# Patient Record
Sex: Male | Born: 1940 | Race: White | Hispanic: No | Smoking: Never smoker
Health system: Southern US, Community
[De-identification: ages and names within clinical notes are randomized; demographics above are authoritative.]

## PROBLEM LIST (undated history)

## (undated) DIAGNOSIS — F329 Major depressive disorder, single episode, unspecified: Secondary | ICD-10-CM

## (undated) DIAGNOSIS — E785 Hyperlipidemia, unspecified: Secondary | ICD-10-CM

## (undated) DIAGNOSIS — R0600 Dyspnea, unspecified: Secondary | ICD-10-CM

## (undated) DIAGNOSIS — J60 Coalworker's pneumoconiosis: Secondary | ICD-10-CM

## (undated) DIAGNOSIS — F32A Depression, unspecified: Secondary | ICD-10-CM

## (undated) DIAGNOSIS — F0390 Unspecified dementia without behavioral disturbance: Secondary | ICD-10-CM

## (undated) DIAGNOSIS — R011 Cardiac murmur, unspecified: Secondary | ICD-10-CM

## (undated) DIAGNOSIS — M1990 Unspecified osteoarthritis, unspecified site: Secondary | ICD-10-CM

## (undated) DIAGNOSIS — I200 Unstable angina: Secondary | ICD-10-CM

## (undated) DIAGNOSIS — Z87442 Personal history of urinary calculi: Secondary | ICD-10-CM

## (undated) HISTORY — DX: Unstable angina: I20.0

## (undated) HISTORY — PX: NO PAST SURGERIES: SHX2092

## (undated) HISTORY — PX: KNEE ARTHROPLASTY: SHX992

---

## 2016-10-20 ENCOUNTER — Encounter (INDEPENDENT_AMBULATORY_CARE_PROVIDER_SITE_OTHER): Payer: PRIVATE HEALTH INSURANCE | Admitting: Ophthalmology

## 2016-10-20 DIAGNOSIS — H2513 Age-related nuclear cataract, bilateral: Secondary | ICD-10-CM | POA: Diagnosis not present

## 2016-10-20 DIAGNOSIS — H338 Other retinal detachments: Secondary | ICD-10-CM

## 2016-10-20 DIAGNOSIS — H43813 Vitreous degeneration, bilateral: Secondary | ICD-10-CM

## 2016-10-21 ENCOUNTER — Encounter (HOSPITAL_COMMUNITY): Payer: Self-pay

## 2016-10-21 ENCOUNTER — Encounter (HOSPITAL_COMMUNITY)
Admission: RE | Admit: 2016-10-21 | Discharge: 2016-10-21 | Disposition: A | Discharge status: Home / Self Care | Payer: Medicare Other | Source: Ambulatory Visit | Attending: Ophthalmology | Admitting: Ophthalmology

## 2016-10-21 DIAGNOSIS — Z01810 Encounter for preprocedural cardiovascular examination: Secondary | ICD-10-CM | POA: Diagnosis not present

## 2016-10-21 DIAGNOSIS — H33001 Unspecified retinal detachment with retinal break, right eye: Secondary | ICD-10-CM | POA: Diagnosis present

## 2016-10-21 DIAGNOSIS — Z01812 Encounter for preprocedural laboratory examination: Secondary | ICD-10-CM | POA: Diagnosis present

## 2016-10-21 HISTORY — DX: Depression, unspecified: F32.A

## 2016-10-21 HISTORY — DX: Personal history of urinary calculi: Z87.442

## 2016-10-21 HISTORY — DX: Dyspnea, unspecified: R06.00

## 2016-10-21 HISTORY — DX: Cardiac murmur, unspecified: R01.1

## 2016-10-21 HISTORY — DX: Unspecified dementia, unspecified severity, without behavioral disturbance, psychotic disturbance, mood disturbance, and anxiety: F03.90

## 2016-10-21 HISTORY — DX: Unspecified osteoarthritis, unspecified site: M19.90

## 2016-10-21 HISTORY — DX: Hyperlipidemia, unspecified: E78.5

## 2016-10-21 HISTORY — DX: Major depressive disorder, single episode, unspecified: F32.9

## 2016-10-21 HISTORY — DX: Coalworker's pneumoconiosis: J60

## 2016-10-21 LAB — CBC
HEMATOCRIT: 39.5 % (ref 39.0–52.0)
HEMOGLOBIN: 13.6 g/dL (ref 13.0–17.0)
MCH: 32 pg (ref 26.0–34.0)
MCHC: 34.4 g/dL (ref 30.0–36.0)
MCV: 92.9 fL (ref 78.0–100.0)
Platelets: 154 10*3/uL (ref 150–400)
RBC: 4.25 MIL/uL (ref 4.22–5.81)
RDW: 12.8 % (ref 11.5–15.5)
WBC: 6.2 10*3/uL (ref 4.0–10.5)

## 2016-10-21 LAB — BASIC METABOLIC PANEL
ANION GAP: 8 (ref 5–15)
BUN: 11 mg/dL (ref 6–20)
CALCIUM: 9.1 mg/dL (ref 8.9–10.3)
CHLORIDE: 109 mmol/L (ref 101–111)
CO2: 23 mmol/L (ref 22–32)
Creatinine, Ser: 1.23 mg/dL (ref 0.61–1.24)
GFR calc non Af Amer: 56 mL/min — ABNORMAL LOW (ref >60)
Glucose, Bld: 83 mg/dL (ref 65–99)
Potassium: 3.8 mmol/L (ref 3.5–5.1)
Sodium: 140 mmol/L (ref 135–145)

## 2016-10-21 NOTE — H&P (Signed)
Brett Mitchell is an 76 y.o. male.   Chief Complaint:  Rapid loss of vision right eye over three days 3 HPI:  Rapid and severe vision loss right eye  Past Medical History:  Diagnosis Date  . Arthritis   . Black lung disease (HCC)   . Dementia   . Depression    situational  . Dyspnea    with exertion  . Heart murmur    as a child  . History of kidney stones    passed some- "still has some"  . Hyperlipemia     Past Surgical History:  Procedure Laterality Date  . NO PAST SURGERIES      No family history on file. Social History:  reports that he has never smoked. He has quit using smokeless tobacco. He reports that he does not drink alcohol or use drugs.  Allergies: No Known Allergies  No prescriptions prior to admission.    Review of systems otherwise negative  There were no vitals taken for this visit.  Physical exam: Mental status: oriented x3. Eyes: See eye exam associated with this date of surgery in media tab.  Scanned in by scanning center Ears, Nose, Throat: within normal limits Neck: Within Normal limits General: within normal limits Chest: Within normal limits Breast: deferred Heart: Within normal limits Abdomen: Within normal limits GU: deferred Extremities: within normal limits Skin: within normal limits  Assessment/Plan Rhegmatogenous retinal detachment right eye Plan: To [facility] for Scleral buckle, laser, gas injection on right eye.  Possible pars plana vitrectomy Right eye  Sherrie George 10/21/2016, 4:46 PM

## 2016-10-21 NOTE — Progress Notes (Signed)
Brett Mitchell is a resident of [facility].  Patient has lost the sight in his right eye.  Brett Mitchell lives alone some , he is staying with his daughter in [place] at times and  One of his daughters stays with him in his home. Patient denies any heart disease or HTN, patient did not know that Maxide is a HTN medication.  Patient has a history of Black Lung and sleep apnea.  Patient does not have a CPAP, he did , but he did not use it so he returned it.  Patient reports that he does not see a pulmonologist. I requested reports for PCP 's office, and from [facility] where he was tested and confirmed for [facility].

## 2016-10-21 NOTE — Pre-Procedure Instructions (Addendum)
    Brett Mitchell  10/21/2016      Your procedure is scheduled on Tuesday, Januray 16.  Report to [facility] Admitting at 9:00AM  - as instructed by Dr Ashley Royalty.   Call this number if you have problems the morning of surgery: [phone]    Remember:  Do not eat food or drink liquids after midnight Monday, January 15.  Take these medicines the morning of surgery with A SIP OF WATER: As instructed by Dr. Ashley Royalty.   Do not wear jewelry, make-up or nail polish.  Do not wear lotions, powders, or perfumes, or deodorant.              Men may shave face and neck.  Do not bring valuables to the hospital.  [facility] is not responsible for any belongings or valuables.  Contacts, dentures or bridgework may not be worn into surgery.  Leave your suitcase in the car.  After surgery it may be brought to your room.  For patients admitted to the hospital, discharge time will be determined by your treatment team.  Special instructions:  Review  [place] - Preparing For Surgery.  Please read over the following fact sheets that you were given. Review  [place] - Preparing For Surgery.

## 2016-10-26 ENCOUNTER — Encounter (HOSPITAL_COMMUNITY)
Admission: RE | Disposition: A | Discharge status: Home / Self Care | Payer: Self-pay | Source: Ambulatory Visit | Attending: Ophthalmology

## 2016-10-26 ENCOUNTER — Ambulatory Visit (HOSPITAL_COMMUNITY): Payer: Medicare Other | Admitting: Anesthesiology

## 2016-10-26 ENCOUNTER — Ambulatory Visit (HOSPITAL_COMMUNITY)
Admission: RE | Admit: 2016-10-26 | Discharge: 2016-10-27 | Disposition: A | Discharge status: Home / Self Care | Payer: Medicare Other | Source: Ambulatory Visit | Attending: Ophthalmology | Admitting: Ophthalmology

## 2016-10-26 ENCOUNTER — Ambulatory Visit (HOSPITAL_COMMUNITY): Payer: Medicare Other | Admitting: Emergency Medicine

## 2016-10-26 ENCOUNTER — Encounter (HOSPITAL_COMMUNITY): Payer: Self-pay | Admitting: *Deleted

## 2016-10-26 DIAGNOSIS — J60 Coalworker's pneumoconiosis: Secondary | ICD-10-CM | POA: Insufficient documentation

## 2016-10-26 DIAGNOSIS — Z87891 Personal history of nicotine dependence: Secondary | ICD-10-CM | POA: Insufficient documentation

## 2016-10-26 DIAGNOSIS — H33001 Unspecified retinal detachment with retinal break, right eye: Secondary | ICD-10-CM | POA: Insufficient documentation

## 2016-10-26 DIAGNOSIS — Z87442 Personal history of urinary calculi: Secondary | ICD-10-CM | POA: Diagnosis not present

## 2016-10-26 DIAGNOSIS — Z79899 Other long term (current) drug therapy: Secondary | ICD-10-CM | POA: Insufficient documentation

## 2016-10-26 DIAGNOSIS — F0390 Unspecified dementia without behavioral disturbance: Secondary | ICD-10-CM | POA: Diagnosis not present

## 2016-10-26 DIAGNOSIS — F329 Major depressive disorder, single episode, unspecified: Secondary | ICD-10-CM | POA: Diagnosis not present

## 2016-10-26 DIAGNOSIS — Z833 Family history of diabetes mellitus: Secondary | ICD-10-CM | POA: Diagnosis not present

## 2016-10-26 DIAGNOSIS — E785 Hyperlipidemia, unspecified: Secondary | ICD-10-CM | POA: Insufficient documentation

## 2016-10-26 DIAGNOSIS — H5461 Unqualified visual loss, right eye, normal vision left eye: Secondary | ICD-10-CM | POA: Diagnosis present

## 2016-10-26 DIAGNOSIS — H338 Other retinal detachments: Secondary | ICD-10-CM | POA: Diagnosis not present

## 2016-10-26 HISTORY — PX: SCLERAL BUCKLE: SHX5340

## 2016-10-26 SURGERY — SCLERAL BUCKLE
Anesthesia: General | Site: Eye | Laterality: Right

## 2016-10-26 MED ORDER — SERTRALINE HCL 100 MG PO TABS: 150.0000 mg | ORAL_TABLET | Freq: Every day | ORAL | Status: DC

## 2016-10-26 MED ORDER — EPHEDRINE SULFATE 50 MG/ML IJ SOLN
INTRAMUSCULAR | Status: DC | PRN
  Administered 2016-10-26: 5 mg via INTRAVENOUS

## 2016-10-26 MED ORDER — TIMOLOL MALEATE 0.5 % OP SOLN
1.0000 [drp] | Freq: Two times a day (BID) | OPHTHALMIC | Status: DC
  Administered 2016-10-26: 1 [drp] via OPHTHALMIC
  Filled 2016-10-26: qty 5

## 2016-10-26 MED ORDER — BUPIVACAINE HCL (PF) 0.75 % IJ SOLN
INTRAMUSCULAR | Status: DC | PRN
  Administered 2016-10-26: 10 mL

## 2016-10-26 MED ORDER — STERILE WATER FOR INJECTION IJ SOLN
INTRAMUSCULAR | Status: AC
  Filled 2016-10-26: qty 20

## 2016-10-26 MED ORDER — HYDROMORPHONE HCL 1 MG/ML IJ SOLN
0.2500 mg | INTRAMUSCULAR | Status: DC | PRN
  Administered 2016-10-26: 0.5 mg via INTRAVENOUS

## 2016-10-26 MED ORDER — GATIFLOXACIN 0.5 % OP SOLN: 1.0000 [drp] | OPHTHALMIC | Status: DC | PRN

## 2016-10-26 MED ORDER — DORZOLAMIDE HCL-TIMOLOL MAL 2-0.5 % OP SOLN: 1.0000 [drp] | Freq: Two times a day (BID) | OPHTHALMIC | Status: DC

## 2016-10-26 MED ORDER — ROCURONIUM BROMIDE 100 MG/10ML IV SOLN
INTRAVENOUS | Status: DC | PRN
  Administered 2016-10-26: 50 mg via INTRAVENOUS
  Administered 2016-10-26: 20 mg via INTRAVENOUS

## 2016-10-26 MED ORDER — PHENYLEPHRINE 40 MCG/ML (10ML) SYRINGE FOR IV PUSH (FOR BLOOD PRESSURE SUPPORT)
PREFILLED_SYRINGE | INTRAVENOUS | Status: AC
  Filled 2016-10-26: qty 10

## 2016-10-26 MED ORDER — BACITRACIN-POLYMYXIN B 500-10000 UNIT/GM OP OINT
TOPICAL_OINTMENT | OPHTHALMIC | Status: AC
Start: 2016-10-26 — End: 2016-10-26
  Filled 2016-10-26: qty 3.5

## 2016-10-26 MED ORDER — STERILE WATER FOR INJECTION IJ SOLN
INTRAMUSCULAR | Status: DC | PRN
  Administered 2016-10-26: 20 mL

## 2016-10-26 MED ORDER — ALBUTEROL SULFATE (2.5 MG/3ML) 0.083% IN NEBU
3.0000 mL | INHALATION_SOLUTION | RESPIRATORY_TRACT | Status: DC | PRN
Start: 2016-10-26 — End: 2016-10-27

## 2016-10-26 MED ORDER — BACITRACIN-POLYMYXIN B 500-10000 UNIT/GM OP OINT
1.0000 "application " | TOPICAL_OINTMENT | Freq: Three times a day (TID) | OPHTHALMIC | Status: DC
  Filled 2016-10-26: qty 3.5

## 2016-10-26 MED ORDER — SUGAMMADEX SODIUM 200 MG/2ML IV SOLN
INTRAVENOUS | Status: AC
  Filled 2016-10-26: qty 2

## 2016-10-26 MED ORDER — HYDROCODONE-ACETAMINOPHEN 5-325 MG PO TABS
ORAL_TABLET | ORAL | Status: AC
  Administered 2016-10-26: 2 via ORAL
  Filled 2016-10-26: qty 2

## 2016-10-26 MED ORDER — ONDANSETRON HCL 4 MG/2ML IJ SOLN
INTRAMUSCULAR | Status: AC
  Filled 2016-10-26: qty 4

## 2016-10-26 MED ORDER — DONEPEZIL HCL 10 MG PO TABS: 10.0000 mg | ORAL_TABLET | Freq: Every evening | ORAL | Status: DC

## 2016-10-26 MED ORDER — STERILE WATER FOR IRRIGATION IR SOLN
Status: DC | PRN
  Administered 2016-10-26: 200 mL

## 2016-10-26 MED ORDER — PREDNISOLONE ACETATE 1 % OP SUSP
1.0000 [drp] | Freq: Four times a day (QID) | OPHTHALMIC | Status: DC
  Filled 2016-10-26: qty 5

## 2016-10-26 MED ORDER — BRIMONIDINE TARTRATE 0.2 % OP SOLN
1.0000 [drp] | Freq: Two times a day (BID) | OPHTHALMIC | Status: DC
  Filled 2016-10-26: qty 5

## 2016-10-26 MED ORDER — SUGAMMADEX SODIUM 200 MG/2ML IV SOLN
INTRAVENOUS | Status: AC
  Filled 2016-10-26: qty 4

## 2016-10-26 MED ORDER — ONDANSETRON HCL 4 MG/2ML IJ SOLN: 4.0000 mg | Freq: Four times a day (QID) | INTRAMUSCULAR | Status: DC | PRN

## 2016-10-26 MED ORDER — PROMETHAZINE HCL 25 MG/ML IJ SOLN: 6.2500 mg | INTRAMUSCULAR | Status: DC | PRN

## 2016-10-26 MED ORDER — SODIUM CHLORIDE 0.9 % IV SOLN
INTRAVENOUS | Status: DC
  Administered 2016-10-26 (×3): via INTRAVENOUS

## 2016-10-26 MED ORDER — FAMOTIDINE 10 MG PO TABS
10.0000 mg | ORAL_TABLET | Freq: Two times a day (BID) | ORAL | Status: DC
  Administered 2016-10-26: 10 mg via ORAL
  Filled 2016-10-26: qty 1

## 2016-10-26 MED ORDER — CLONAZEPAM 1 MG PO TABS
1.0000 mg | ORAL_TABLET | Freq: Every day | ORAL | Status: DC
  Administered 2016-10-26: 1 mg via ORAL
  Filled 2016-10-26: qty 1

## 2016-10-26 MED ORDER — TEMAZEPAM 15 MG PO CAPS: 15.0000 mg | ORAL_CAPSULE | Freq: Every evening | ORAL | Status: DC | PRN

## 2016-10-26 MED ORDER — BUPIVACAINE HCL (PF) 0.75 % IJ SOLN
INTRAMUSCULAR | Status: AC
  Filled 2016-10-26: qty 10

## 2016-10-26 MED ORDER — LABETALOL HCL 5 MG/ML IV SOLN
INTRAVENOUS | Status: AC
  Filled 2016-10-26: qty 4

## 2016-10-26 MED ORDER — DORZOLAMIDE HCL 2 % OP SOLN
1.0000 [drp] | Freq: Three times a day (TID) | OPHTHALMIC | Status: DC
  Filled 2016-10-26: qty 10

## 2016-10-26 MED ORDER — LIDOCAINE 2% (20 MG/ML) 5 ML SYRINGE
INTRAMUSCULAR | Status: AC
  Filled 2016-10-26: qty 5

## 2016-10-26 MED ORDER — PROPOFOL 10 MG/ML IV BOLUS
INTRAVENOUS | Status: DC | PRN
  Administered 2016-10-26: 150 mg via INTRAVENOUS

## 2016-10-26 MED ORDER — IBUPROFEN 800 MG PO TABS
800.0000 mg | ORAL_TABLET | Freq: Three times a day (TID) | ORAL | Status: DC | PRN
  Administered 2016-10-26: 800 mg via ORAL
  Filled 2016-10-26: qty 1

## 2016-10-26 MED ORDER — VALACYCLOVIR HCL 500 MG PO TABS
500.0000 mg | ORAL_TABLET | Freq: Every day | ORAL | Status: DC
  Filled 2016-10-26 (×2): qty 1

## 2016-10-26 MED ORDER — TROPICAMIDE 1 % OP SOLN: 1.0000 [drp] | OPHTHALMIC | Status: DC | PRN

## 2016-10-26 MED ORDER — CEFTAZIDIME 1 G IJ SOLR
INTRAMUSCULAR | Status: AC
  Filled 2016-10-26: qty 1

## 2016-10-26 MED ORDER — GATIFLOXACIN 0.5 % OP SOLN
OPHTHALMIC | Status: AC
  Filled 2016-10-26: qty 2.5

## 2016-10-26 MED ORDER — ONDANSETRON HCL 4 MG/2ML IJ SOLN
INTRAMUSCULAR | Status: DC | PRN
  Administered 2016-10-26: 4 mg via INTRAVENOUS

## 2016-10-26 MED ORDER — DORZOLAMIDE HCL 2 % OP SOLN
1.0000 [drp] | Freq: Two times a day (BID) | OPHTHALMIC | Status: DC
  Administered 2016-10-26: 1 [drp] via OPHTHALMIC
  Filled 2016-10-26: qty 10

## 2016-10-26 MED ORDER — DEXAMETHASONE SODIUM PHOSPHATE 10 MG/ML IJ SOLN
INTRAMUSCULAR | Status: DC | PRN
  Administered 2016-10-26: 10 mg

## 2016-10-26 MED ORDER — POLYMYXIN B SULFATE 500000 UNITS IJ SOLR
INTRAMUSCULAR | Status: AC
Start: 2016-10-26 — End: 2016-10-26
  Filled 2016-10-26: qty 500000

## 2016-10-26 MED ORDER — FENTANYL CITRATE (PF) 100 MCG/2ML IJ SOLN
INTRAMUSCULAR | Status: AC
  Filled 2016-10-26: qty 2

## 2016-10-26 MED ORDER — CYPROHEPTADINE HCL 4 MG PO TABS: 4.0000 mg | ORAL_TABLET | Freq: Every day | ORAL | Status: DC

## 2016-10-26 MED ORDER — DEXAMETHASONE SODIUM PHOSPHATE 10 MG/ML IJ SOLN
INTRAMUSCULAR | Status: AC
  Filled 2016-10-26: qty 1

## 2016-10-26 MED ORDER — TRAZODONE HCL 50 MG PO TABS
50.0000 mg | ORAL_TABLET | Freq: Every day | ORAL | Status: DC
  Filled 2016-10-26: qty 1

## 2016-10-26 MED ORDER — BSS IO SOLN
INTRAOCULAR | Status: DC | PRN
  Administered 2016-10-26: 15 mL via INTRAOCULAR

## 2016-10-26 MED ORDER — FENTANYL CITRATE (PF) 100 MCG/2ML IJ SOLN
INTRAMUSCULAR | Status: DC | PRN
  Administered 2016-10-26 (×2): 25 ug via INTRAVENOUS
  Administered 2016-10-26: 50 ug via INTRAVENOUS

## 2016-10-26 MED ORDER — HYDROMORPHONE HCL 1 MG/ML IJ SOLN
INTRAMUSCULAR | Status: AC
Start: 2016-10-26 — End: 2016-10-27
  Filled 2016-10-26: qty 0.5

## 2016-10-26 MED ORDER — EPINEPHRINE PF 1 MG/ML IJ SOLN
INTRAMUSCULAR | Status: AC
  Filled 2016-10-26: qty 1

## 2016-10-26 MED ORDER — CYCLOPENTOLATE HCL 1 % OP SOLN
OPHTHALMIC | Status: AC
  Administered 2016-10-26: 12:00:00
  Filled 2016-10-26: qty 2

## 2016-10-26 MED ORDER — ONDANSETRON HCL 4 MG/2ML IJ SOLN
INTRAMUSCULAR | Status: AC
  Filled 2016-10-26: qty 2

## 2016-10-26 MED ORDER — TRIAMTERENE-HCTZ 37.5-25 MG PO TABS
1.0000 | ORAL_TABLET | Freq: Every day | ORAL | Status: DC
  Filled 2016-10-26 (×2): qty 1

## 2016-10-26 MED ORDER — CEFAZOLIN SODIUM-DEXTROSE 2-4 GM/100ML-% IV SOLN
2.0000 g | INTRAVENOUS | Status: AC
  Administered 2016-10-26: 2 g via INTRAVENOUS
  Filled 2016-10-26: qty 100

## 2016-10-26 MED ORDER — GLYCOPYRROLATE 0.2 MG/ML IJ SOLN
INTRAMUSCULAR | Status: DC | PRN
  Administered 2016-10-26: 0.2 mg via INTRAVENOUS
  Administered 2016-10-26: 0.1 mg via INTRAVENOUS

## 2016-10-26 MED ORDER — PHENYLEPHRINE HCL 2.5 % OP SOLN
1.0000 [drp] | OPHTHALMIC | Status: AC | PRN
  Administered 2016-10-26 (×3): 1 [drp] via OPHTHALMIC

## 2016-10-26 MED ORDER — ACETAMINOPHEN 325 MG PO TABS: 325.0000 mg | ORAL_TABLET | ORAL | Status: DC | PRN

## 2016-10-26 MED ORDER — BACITRACIN-POLYMYXIN B 500-10000 UNIT/GM OP OINT
TOPICAL_OINTMENT | OPHTHALMIC | Status: DC | PRN
  Administered 2016-10-26: 1 via OPHTHALMIC

## 2016-10-26 MED ORDER — BSS IO SOLN
INTRAOCULAR | Status: AC
  Filled 2016-10-26: qty 15

## 2016-10-26 MED ORDER — ROCURONIUM BROMIDE 50 MG/5ML IV SOSY
PREFILLED_SYRINGE | INTRAVENOUS | Status: AC
  Filled 2016-10-26: qty 10

## 2016-10-26 MED ORDER — GATIFLOXACIN 0.5 % OP SOLN
1.0000 [drp] | Freq: Four times a day (QID) | OPHTHALMIC | Status: DC
  Filled 2016-10-26: qty 2.5

## 2016-10-26 MED ORDER — TROPICAMIDE 1 % OP SOLN
OPHTHALMIC | Status: AC
  Filled 2016-10-26: qty 15

## 2016-10-26 MED ORDER — HYDROCODONE-ACETAMINOPHEN 5-325 MG PO TABS
1.0000 | ORAL_TABLET | ORAL | Status: DC | PRN
  Administered 2016-10-26: 2 via ORAL
  Filled 2016-10-26: qty 2

## 2016-10-26 MED ORDER — BSS PLUS IO SOLN
INTRAOCULAR | Status: AC
  Filled 2016-10-26: qty 500

## 2016-10-26 MED ORDER — LIDOCAINE HCL 2 % IJ SOLN
INTRAMUSCULAR | Status: AC
  Filled 2016-10-26: qty 20

## 2016-10-26 MED ORDER — CYCLOPENTOLATE HCL 1 % OP SOLN: 1.0000 [drp] | OPHTHALMIC | Status: DC | PRN

## 2016-10-26 MED ORDER — TROPICAMIDE 1 % OP SOLN
1.0000 [drp] | OPHTHALMIC | Status: AC | PRN
  Administered 2016-10-26 (×3): 1 [drp] via OPHTHALMIC

## 2016-10-26 MED ORDER — SUCCINYLCHOLINE CHLORIDE 200 MG/10ML IV SOSY
PREFILLED_SYRINGE | INTRAVENOUS | Status: AC
Start: 2016-10-26 — End: 2016-10-26
  Filled 2016-10-26: qty 10

## 2016-10-26 MED ORDER — LIDOCAINE HCL (CARDIAC) 20 MG/ML IV SOLN
INTRAVENOUS | Status: DC | PRN
  Administered 2016-10-26: 60 mg via INTRATRACHEAL
  Administered 2016-10-26: 100 mg via INTRAVENOUS

## 2016-10-26 MED ORDER — SODIUM CHLORIDE 0.45 % IV SOLN: INTRAVENOUS | Status: DC

## 2016-10-26 MED ORDER — 0.9 % SODIUM CHLORIDE (POUR BTL) OPTIME
TOPICAL | Status: DC | PRN
  Administered 2016-10-26: 200 mL

## 2016-10-26 MED ORDER — PHENYLEPHRINE HCL 2.5 % OP SOLN: 1.0000 [drp] | OPHTHALMIC | Status: DC | PRN

## 2016-10-26 MED ORDER — SUGAMMADEX SODIUM 500 MG/5ML IV SOLN
INTRAVENOUS | Status: AC
  Filled 2016-10-26: qty 5

## 2016-10-26 MED ORDER — ACETAZOLAMIDE SODIUM 500 MG IJ SOLR
500.0000 mg | Freq: Once | INTRAMUSCULAR | Status: AC
  Administered 2016-10-27: 500 mg via INTRAVENOUS

## 2016-10-26 MED ORDER — LIDOCAINE 2% (20 MG/ML) 5 ML SYRINGE
INTRAMUSCULAR | Status: AC
  Filled 2016-10-26: qty 20

## 2016-10-26 MED ORDER — FENOFIBRATE 54 MG PO TABS: 54.0000 mg | ORAL_TABLET | Freq: Every day | ORAL | Status: DC

## 2016-10-26 MED ORDER — PHENYLEPHRINE HCL 2.5 % OP SOLN
OPHTHALMIC | Status: AC
  Filled 2016-10-26: qty 2

## 2016-10-26 MED ORDER — MORPHINE SULFATE (PF) 2 MG/ML IV SOLN
1.0000 mg | INTRAVENOUS | Status: DC | PRN
  Administered 2016-10-26: 2 mg via INTRAVENOUS
  Filled 2016-10-26: qty 1

## 2016-10-26 MED ORDER — OXYCODONE HCL ER 10 MG PO T12A
10.0000 mg | EXTENDED_RELEASE_TABLET | Freq: Two times a day (BID) | ORAL | Status: DC
  Administered 2016-10-26: 10 mg via ORAL
  Filled 2016-10-26: qty 1

## 2016-10-26 MED ORDER — LATANOPROST 0.005 % OP SOLN
1.0000 [drp] | Freq: Every day | OPHTHALMIC | Status: DC
  Filled 2016-10-26: qty 2.5

## 2016-10-26 MED ORDER — EPHEDRINE 5 MG/ML INJ
INTRAVENOUS | Status: AC
  Filled 2016-10-26: qty 10

## 2016-10-26 MED ORDER — SUGAMMADEX SODIUM 200 MG/2ML IV SOLN
INTRAVENOUS | Status: DC | PRN
  Administered 2016-10-26: 200 mg via INTRAVENOUS

## 2016-10-26 MED ORDER — MAGNESIUM HYDROXIDE 400 MG/5ML PO SUSP: 15.0000 mL | Freq: Four times a day (QID) | ORAL | Status: DC | PRN

## 2016-10-26 MED ORDER — OXYCODONE HCL 5 MG PO TABS: 10.0000 mg | ORAL_TABLET | Freq: Four times a day (QID) | ORAL | Status: DC | PRN

## 2016-10-26 MED ORDER — CYCLOPENTOLATE HCL 1 % OP SOLN
1.0000 [drp] | OPHTHALMIC | Status: DC | PRN
  Administered 2016-10-26 (×2): 1 [drp] via OPHTHALMIC

## 2016-10-26 MED ORDER — HEMOSTATIC AGENTS (NO CHARGE) OPTIME
TOPICAL | Status: DC | PRN
  Administered 2016-10-26: 1 via TOPICAL

## 2016-10-26 MED ORDER — ROCURONIUM BROMIDE 50 MG/5ML IV SOSY
PREFILLED_SYRINGE | INTRAVENOUS | Status: AC
  Filled 2016-10-26: qty 5

## 2016-10-26 MED ORDER — ATROPINE SULFATE 1 % OP SOLN
OPHTHALMIC | Status: AC
  Filled 2016-10-26: qty 5

## 2016-10-26 MED ORDER — SODIUM CHLORIDE 0.9 % IJ SOLN
INTRAMUSCULAR | Status: AC
  Filled 2016-10-26: qty 10

## 2016-10-26 MED ORDER — HYPROMELLOSE (GONIOSCOPIC) 2.5 % OP SOLN
OPHTHALMIC | Status: AC
  Filled 2016-10-26: qty 15

## 2016-10-26 MED ORDER — TRIAMCINOLONE ACETONIDE 40 MG/ML IJ SUSP
INTRAMUSCULAR | Status: AC
  Filled 2016-10-26: qty 5

## 2016-10-26 MED ORDER — GATIFLOXACIN 0.5 % OP SOLN
1.0000 [drp] | OPHTHALMIC | Status: AC | PRN
  Administered 2016-10-26 (×3): 1 [drp] via OPHTHALMIC

## 2016-10-26 MED ORDER — TAMSULOSIN HCL 0.4 MG PO CAPS
0.4000 mg | ORAL_CAPSULE | Freq: Every day | ORAL | Status: DC
  Administered 2016-10-26: 0.4 mg via ORAL
  Filled 2016-10-26: qty 1

## 2016-10-26 MED ORDER — TETRACAINE HCL 0.5 % OP SOLN
2.0000 [drp] | Freq: Once | OPHTHALMIC | Status: DC
  Filled 2016-10-26: qty 2

## 2016-10-26 MED ORDER — ACETAZOLAMIDE SODIUM 500 MG IJ SOLR
INTRAMUSCULAR | Status: AC
  Filled 2016-10-26: qty 500

## 2016-10-26 SURGICAL SUPPLY — 73 items
APPLICATOR DR MATTHEWS STRL (MISCELLANEOUS) ×18 IMPLANT
BANDAGE EYE OVAL (MISCELLANEOUS) ×3 IMPLANT
BLADE EYE CATARACT 19 1.4 BEAV (BLADE) ×3 IMPLANT
BLADE MVR KNIFE 19G (BLADE) IMPLANT
CANNULA ANT CHAM MAIN (OPHTHALMIC RELATED) IMPLANT
CANNULA DUAL BORE 23G (CANNULA) IMPLANT
CORDS BIPOLAR (ELECTRODE) IMPLANT
COTTONBALL LRG STERILE PKG (GAUZE/BANDAGES/DRESSINGS) ×9 IMPLANT
COVER MAYO STAND STRL (DRAPES) IMPLANT
COVER SURGICAL LIGHT HANDLE (MISCELLANEOUS) ×3 IMPLANT
DRAPE INCISE 51X51 W/FILM STRL (DRAPES) ×3 IMPLANT
DRAPE OPHTHALMIC 77X100 STRL (CUSTOM PROCEDURE TRAY) ×3 IMPLANT
ERASER HMR WETFIELD 23G BP (MISCELLANEOUS) IMPLANT
FILTER BLUE MILLIPORE (MISCELLANEOUS) IMPLANT
FILTER STRAW FLUID ASPIR (MISCELLANEOUS) IMPLANT
FORCEPS GRIESHABER ILM 25G A (INSTRUMENTS) IMPLANT
GAS AUTO FILL CONSTEL (OPHTHALMIC)
GAS AUTO FILL CONSTELLATION (OPHTHALMIC) IMPLANT
GLOVE BIOGEL PI IND STRL 7.0 (GLOVE) ×1 IMPLANT
GLOVE BIOGEL PI INDICATOR 7.0 (GLOVE) ×2
GLOVE SS BIOGEL STRL SZ 6.5 (GLOVE) ×2 IMPLANT
GLOVE SS BIOGEL STRL SZ 7 (GLOVE) ×1 IMPLANT
GLOVE SUPERSENSE BIOGEL SZ 6.5 (GLOVE) ×4
GLOVE SUPERSENSE BIOGEL SZ 7 (GLOVE) ×2
GLOVE SURG 8.5 LATEX PF (GLOVE) ×3 IMPLANT
GLOVE SURG SS PI 6.5 STRL IVOR (GLOVE) ×6 IMPLANT
GOWN STRL REUS W/ TWL LRG LVL3 (GOWN DISPOSABLE) ×4 IMPLANT
GOWN STRL REUS W/TWL LRG LVL3 (GOWN DISPOSABLE) ×8
HANDLE PNEUMATIC FOR CONSTEL (OPHTHALMIC) IMPLANT
IMPL SILICONE (Ophthalmic Related) ×1 IMPLANT
IMPLANT SILICONE (Ophthalmic Related) ×5 IMPLANT
KIT BASIN OR (CUSTOM PROCEDURE TRAY) ×3 IMPLANT
KIT PERFLUORON PROCEDURE 5ML (MISCELLANEOUS) IMPLANT
KIT ROOM TURNOVER OR (KITS) ×3 IMPLANT
KNIFE CRESCENT 1.75 EDGEAHEAD (BLADE) IMPLANT
KNIFE GRIESHABER SHARP 2.5MM (MISCELLANEOUS) ×9 IMPLANT
MICROPICK 25G (MISCELLANEOUS)
NEEDLE 18GX1X1/2 (RX/OR ONLY) (NEEDLE) ×3 IMPLANT
NEEDLE 25GX 5/8IN NON SAFETY (NEEDLE) IMPLANT
NEEDLE HYPO 30X.5 LL (NEEDLE) IMPLANT
NEEDLE PRECISIONGLIDE 27X1.5 (NEEDLE) IMPLANT
NS IRRIG 1000ML POUR BTL (IV SOLUTION) ×3 IMPLANT
PACK VITRECTOMY CUSTOM (CUSTOM PROCEDURE TRAY) ×3 IMPLANT
PAD ARMBOARD 7.5X6 YLW CONV (MISCELLANEOUS) ×3 IMPLANT
PAK PIK VITRECTOMY CVS 25GA (OPHTHALMIC) IMPLANT
PIC ILLUMINATED 25G (OPHTHALMIC)
PICK MICROPICK 25G (MISCELLANEOUS) IMPLANT
PIK ILLUMINATED 25G (OPHTHALMIC) IMPLANT
PROBE LASER ILLUM FLEX CVD 25G (OPHTHALMIC) IMPLANT
REPL STRA BRUSH NEEDLE (NEEDLE) IMPLANT
RESERVOIR BACK FLUSH (MISCELLANEOUS) IMPLANT
ROLLS DENTAL (MISCELLANEOUS) ×6 IMPLANT
SLEEVE SCLERAL TYPE 270 (Ophthalmic Related) ×3 IMPLANT
SPEAR EYE SURG WECK-CEL (MISCELLANEOUS) ×12 IMPLANT
SPONGE SURGIFOAM ABS GEL 12-7 (HEMOSTASIS) ×3 IMPLANT
STOPCOCK 4 WAY LG BORE MALE ST (IV SETS) IMPLANT
SUT CHROMIC 7 0 TG140 8 (SUTURE) ×3 IMPLANT
SUT ETHILON 9 0 TG140 8 (SUTURE) IMPLANT
SUT MERSILENE 4 0 RV 2 (SUTURE) ×6 IMPLANT
SUT SILK 2 0 (SUTURE) ×2
SUT SILK 2-0 18XBRD TIE 12 (SUTURE) ×1 IMPLANT
SUT SILK 4 0 RB 1 (SUTURE) ×3 IMPLANT
SYR 20CC LL (SYRINGE) IMPLANT
SYR 50ML LL SCALE MARK (SYRINGE) IMPLANT
SYR 5ML LL (SYRINGE) IMPLANT
SYR BULB 3OZ (MISCELLANEOUS) ×3 IMPLANT
SYR TB 1ML LUER SLIP (SYRINGE) IMPLANT
SYRINGE 10CC LL (SYRINGE) IMPLANT
TIRE RTNL 2.5XGRV CNCV 9X (Ophthalmic Related) ×1 IMPLANT
TOWEL OR 17X24 6PK STRL BLUE (TOWEL DISPOSABLE) ×3 IMPLANT
TUBING HIGH PRESS EXTEN 6IN (TUBING) IMPLANT
WATER STERILE IRR 1000ML POUR (IV SOLUTION) ×3 IMPLANT
WIPE INSTRUMENT VISIWIPE 73X73 (MISCELLANEOUS) IMPLANT

## 2016-10-26 NOTE — Transfer of Care (Signed)
Immediate Anesthesia Transfer of Care Note  Patient: Brett Mitchell  Procedure(s) Performed: Procedure(s): SCLERAL BUCKLE, C3F8 INJECTION, RIGHT EYE (Right)  Patient Location: PACU  Anesthesia Type:General  Level of Consciousness: awake and alert   Airway & Oxygen Therapy: Patient Spontanous Breathing and Patient connected to nasal cannula oxygen  Post-op Assessment: Report given to RN and Post -op Vital signs reviewed and stable  Post vital signs: Reviewed and stable  Last Vitals:  Vitals:   10/26/16 0912 10/26/16 1630  BP: (!) 188/88   Pulse: (!) 58   Resp: 20   Temp: 36.8 C 36.8 C    Last Pain:  Vitals:   10/26/16 1148  TempSrc: Oral      Patients Stated Pain Goal: 2 (10/26/16 1148)  Complications: No apparent anesthesia complications

## 2016-10-26 NOTE — Anesthesia Preprocedure Evaluation (Addendum)
Anesthesia Evaluation  Patient identified by MRN, date of birth, ID band Patient awake    Reviewed: Allergy & Precautions, NPO status , Patient's Chart, lab work & pertinent test results  History of Anesthesia Complications Negative for: history of anesthetic complications  Airway Mallampati: II  TM Distance: >3 FB Neck ROM: Full    Dental  (+) Edentulous Upper   Pulmonary shortness of breath,  Hx of black lung, not on O2   breath sounds clear to auscultation       Cardiovascular  Rhythm:Regular Rate:Normal + Systolic murmurs    Neuro/Psych negative neurological ROS     GI/Hepatic negative GI ROS, Neg liver ROS,   Endo/Other  negative endocrine ROS  Renal/GU negative Renal ROS     Musculoskeletal  (+) Arthritis ,   Abdominal   Peds  Hematology negative hematology ROS (+)   Anesthesia Other Findings   Reproductive/Obstetrics                            Anesthesia Physical Anesthesia Plan  ASA: II  Anesthesia Plan: General   Post-op Pain Management:    Induction: Intravenous  Airway Management Planned: Oral ETT  Additional Equipment:   Intra-op Plan:   Post-operative Plan: Extubation in OR  Informed Consent: I have reviewed the patients History and Physical, chart, labs and discussed the procedure including the risks, benefits and alternatives for the proposed anesthesia with the patient or authorized representative who has indicated his/her understanding and acceptance.   Dental advisory given  Plan Discussed with: CRNA  Anesthesia Plan Comments:         Anesthesia Quick Evaluation

## 2016-10-26 NOTE — Anesthesia Procedure Notes (Signed)
Procedure Name: Intubation Date/Time: 10/26/2016 2:05 PM Performed by: Rica Koyanagi Pre-anesthesia Checklist: Patient identified, Emergency Drugs available, Suction available and Patient being monitored Patient Re-evaluated:Patient Re-evaluated prior to inductionOxygen Delivery Method: Circle System Utilized Preoxygenation: Pre-oxygenation with 100% oxygen Intubation Type: IV induction Ventilation: Mask ventilation without difficulty and Oral airway inserted - appropriate to patient size Laryngoscope Size: Mac and 3 Grade View: Grade I Tube type: Oral Number of attempts: 1 Airway Equipment and Method: Stylet and Oral airway Placement Confirmation: ETT inserted through vocal cords under direct vision,  positive ETCO2 and breath sounds checked- equal and bilateral Secured at: 22 cm Tube secured with: Tape Dental Injury: Teeth and Oropharynx as per pre-operative assessment

## 2016-10-26 NOTE — Brief Op Note (Signed)
Brief Operative note   Preoperative diagnosis:  retinal detachment right eye Postoperative diagnosis  * No Diagnosis Codes entered *  Procedures: Scleral buckle, laser, gas injection right eye  Surgeon:  Sherrie George, MD...  Assistant:  Rosalie Doctor SA  Anesthesia: General  Specimen: none  Estimated blood loss:  1cc  Complications: none  Patient sent to PACU in good condition  Composed by Sherrie George MD  Dictation number: [id number]

## 2016-10-26 NOTE — H&P (Signed)
I examined the patient today and there is no change in the medical status 

## 2016-10-27 DIAGNOSIS — H33001 Unspecified retinal detachment with retinal break, right eye: Secondary | ICD-10-CM | POA: Diagnosis not present

## 2016-10-27 MED ORDER — PREDNISOLONE ACETATE 1 % OP SUSP: 1.0000 [drp] | Freq: Four times a day (QID) | OPHTHALMIC | 0 refills | Status: DC

## 2016-10-27 MED ORDER — NEPAFENAC 0.1 % OP SUSP
1.0000 [drp] | Freq: Two times a day (BID) | OPHTHALMIC | Status: DC
  Administered 2016-10-27: 1 [drp] via OPHTHALMIC
  Filled 2016-10-27: qty 3

## 2016-10-27 MED ORDER — BACITRACIN-POLYMYXIN B 500-10000 UNIT/GM OP OINT: 1.0000 "application " | TOPICAL_OINTMENT | Freq: Three times a day (TID) | OPHTHALMIC | 0 refills | Status: DC

## 2016-10-27 MED ORDER — GATIFLOXACIN 0.5 % OP SOLN: 1.0000 [drp] | Freq: Four times a day (QID) | OPHTHALMIC | Status: DC

## 2016-10-27 NOTE — Progress Notes (Signed)
10/27/2016, 9:29 AM  Mental Status:  Awake, Alert, Oriented  Anterior segment: Cornea  Clear    Anterior Chamber Clear    Lens:   Cataract  Intra Ocular Pressure 13 mmHg with Tonopen  Vitreous: Clear 20%gas bubble   Retina:  Attached Good laser reaction   Impression: Excellent result Retina attached   Final Diagnosis: Principal Problem:   Macula-off rhegmatogenous retinal detachment, right   Plan: start post operative eye drops.  Discharge to home.  Give post operative instructions  Sherrie George 10/27/2016, 9:29 AM

## 2016-10-27 NOTE — Discharge Summary (Signed)
Discharge summary not needed on [facility] patients per medical records. 

## 2016-10-27 NOTE — Op Note (Signed)
NAME:  Brett Mitchell, Brett Mitchell NO.:  [id number]  MEDICAL RECORD NO.:  [id number]  LOCATION:                                 FACILITY:  PHYSICIAN:  Cindee Mclester D. Ashley Royalty, M.D. DATE OF BIRTH:  Jul 30, 1941  DATE OF PROCEDURE:  10/26/2016 DATE OF DISCHARGE:                              OPERATIVE REPORT   ADMISSION DIAGNOSIS:  Rhegmatogenous retinal detachment, right eye.  PROCEDURE:  Scleral buckle, right eye; gas injection, right eye; retinal photocoagulation, right eye.  SURGEON:  Beulah Gandy. Ashley Royalty, M.D.  ASSISTANT:  Rosalie Doctor, SA.  ANESTHESIA:  General.  DETAILS:  After usual prep and drape, 360 degree limbal peritomy, isolation of 4 rectus muscles on 2-0 silk, localization of detached retina from 5 o'clock to 11 o'clock.  Scleral dissection for 360 degrees to admit a #279 intrascleral implant.  Diathermy placed in the bed, 2 sutures per quadrant for a total of 8 scleral sutures were placed.  The scleral buckle was placed around the globe and trimmed with the joint at 2 o'clock.  240 band was placed around the eye with a 270 sleeve at 2 o'clock.  Perforation site was chosen at 7 o'clock in the posterior aspect of the bed after cutdown on the sclera and diathermy of choroid. The subretinal space was entered with a sharp probe.  Large amount of thick colorless subretinal fluid came forth in a controlled manner.  The retina reattached during this maneuver.  Scleral sutures were drawn securely to perform a proper indentation of the globe.  The buckle was trimmed.  The band was trimmed.  The scleral sutures were drawn securely, knotted, and the free ends removed.  Indirect ophthalmoscopy showed the retina to be lying nicely on the scleral buckle with minimal subretinal fluid remaining.  The view was dim because of dense cataract. C3F8 0.2 mL 100% was injected through the pars plana into the vitreous at 12 o'clock, paracentesis x1 at 12 o'clock obtained a closing  pressure of 10 with a Baer keratometer.  The conjunctiva was reposited with 7-0 chromic suture.  Polymyxin and ceftazidime were injected around the globe for antibiotic coverage.  Decadron 10 mg was injected into the lower subconjunctival space.  Atropine solution was applied.  Marcaine was injected around the globe for postop pain.  Polysporin ophthalmic ointment was placed and a patch and shield were placed.  The closing pressure was 10 with Matt Holmes keratometer.  The patient was awakened and taken to recovery room in a satisfactory condition.  COMPLICATIONS:  None.  DURATION:  2 hours.     Beulah Gandy. Ashley Royalty, M.D.   ______________________________ Beulah Gandy. Ashley Royalty, M.D.    JDM/MEDQ  D:  10/26/2016  T:  10/27/2016  Job:  [id number]

## 2016-10-28 ENCOUNTER — Encounter (HOSPITAL_COMMUNITY): Payer: Self-pay | Admitting: Ophthalmology

## 2016-10-29 NOTE — Anesthesia Postprocedure Evaluation (Addendum)
Anesthesia Post Note  Patient: Brett Mitchell  Procedure(s) Performed: Procedure(s) (LRB): SCLERAL BUCKLE, C3F8 INJECTION, RIGHT EYE (Right)  Patient location during evaluation: PACU Anesthesia Type: General Level of consciousness: awake and alert Pain management: pain level controlled Vital Signs Assessment: post-procedure vital signs reviewed and stable Respiratory status: spontaneous breathing, nonlabored ventilation, respiratory function stable and patient connected to nasal cannula oxygen Cardiovascular status: blood pressure returned to baseline and stable Postop Assessment: no signs of nausea or vomiting Anesthetic complications: no       Last Vitals:  Vitals:   10/26/16 2319 10/27/16 0509  BP: 126/68 (!) 112/56  Pulse:  66  Resp:  16  Temp:  36.9 C    Last Pain:  Vitals:   10/27/16 1600  TempSrc:   PainSc: 2                  Arlan Birks,JAMES TERRILL

## 2016-11-02 ENCOUNTER — Encounter (INDEPENDENT_AMBULATORY_CARE_PROVIDER_SITE_OTHER): Payer: PRIVATE HEALTH INSURANCE | Admitting: Ophthalmology

## 2016-11-02 DIAGNOSIS — H338 Other retinal detachments: Secondary | ICD-10-CM

## 2016-11-23 ENCOUNTER — Encounter (INDEPENDENT_AMBULATORY_CARE_PROVIDER_SITE_OTHER): Payer: Medicare Other | Admitting: Ophthalmology

## 2016-11-23 DIAGNOSIS — H338 Other retinal detachments: Secondary | ICD-10-CM

## 2017-01-31 ENCOUNTER — Encounter (INDEPENDENT_AMBULATORY_CARE_PROVIDER_SITE_OTHER): Payer: Medicare Other | Admitting: Ophthalmology

## 2017-02-01 ENCOUNTER — Encounter (INDEPENDENT_AMBULATORY_CARE_PROVIDER_SITE_OTHER): Payer: PRIVATE HEALTH INSURANCE | Admitting: Ophthalmology

## 2017-02-01 ENCOUNTER — Encounter (INDEPENDENT_AMBULATORY_CARE_PROVIDER_SITE_OTHER): Payer: Medicare Other | Admitting: Ophthalmology

## 2017-02-01 DIAGNOSIS — H43813 Vitreous degeneration, bilateral: Secondary | ICD-10-CM | POA: Diagnosis not present

## 2017-02-01 DIAGNOSIS — H338 Other retinal detachments: Secondary | ICD-10-CM | POA: Diagnosis not present

## 2017-03-11 NOTE — Addendum Note (Signed)
Addendum  created 03/11/17 0934 by Sharee Holster, MD   Sign clinical note

## 2017-07-27 ENCOUNTER — Encounter (INDEPENDENT_AMBULATORY_CARE_PROVIDER_SITE_OTHER): Payer: PRIVATE HEALTH INSURANCE | Admitting: Ophthalmology

## 2017-07-27 DIAGNOSIS — I10 Essential (primary) hypertension: Secondary | ICD-10-CM

## 2017-07-27 DIAGNOSIS — H43813 Vitreous degeneration, bilateral: Secondary | ICD-10-CM

## 2017-07-27 DIAGNOSIS — H338 Other retinal detachments: Secondary | ICD-10-CM

## 2017-07-27 DIAGNOSIS — H35033 Hypertensive retinopathy, bilateral: Secondary | ICD-10-CM

## 2017-08-08 ENCOUNTER — Ambulatory Visit (INDEPENDENT_AMBULATORY_CARE_PROVIDER_SITE_OTHER): Payer: PRIVATE HEALTH INSURANCE | Admitting: Ophthalmology

## 2018-04-26 ENCOUNTER — Encounter (HOSPITAL_COMMUNITY): Payer: Self-pay | Admitting: *Deleted

## 2018-04-26 ENCOUNTER — Other Ambulatory Visit: Payer: Self-pay

## 2018-04-26 ENCOUNTER — Observation Stay (HOSPITAL_COMMUNITY)
Admission: AD | Admit: 2018-04-26 | Discharge: 2018-04-27 | Disposition: A | Discharge status: Home / Self Care | Payer: Medicare Other | Source: Other Acute Inpatient Hospital | Attending: Cardiology | Admitting: Cardiology

## 2018-04-26 DIAGNOSIS — Z7982 Long term (current) use of aspirin: Secondary | ICD-10-CM | POA: Insufficient documentation

## 2018-04-26 DIAGNOSIS — J60 Coalworker's pneumoconiosis: Secondary | ICD-10-CM | POA: Insufficient documentation

## 2018-04-26 DIAGNOSIS — Z87891 Personal history of nicotine dependence: Secondary | ICD-10-CM | POA: Insufficient documentation

## 2018-04-26 DIAGNOSIS — N183 Chronic kidney disease, stage 3 unspecified: Secondary | ICD-10-CM

## 2018-04-26 DIAGNOSIS — N184 Chronic kidney disease, stage 4 (severe): Secondary | ICD-10-CM | POA: Diagnosis not present

## 2018-04-26 DIAGNOSIS — F329 Major depressive disorder, single episode, unspecified: Secondary | ICD-10-CM | POA: Diagnosis not present

## 2018-04-26 DIAGNOSIS — M1990 Unspecified osteoarthritis, unspecified site: Secondary | ICD-10-CM | POA: Insufficient documentation

## 2018-04-26 DIAGNOSIS — I428 Other cardiomyopathies: Secondary | ICD-10-CM | POA: Diagnosis not present

## 2018-04-26 DIAGNOSIS — E785 Hyperlipidemia, unspecified: Secondary | ICD-10-CM

## 2018-04-26 DIAGNOSIS — D631 Anemia in chronic kidney disease: Secondary | ICD-10-CM | POA: Insufficient documentation

## 2018-04-26 DIAGNOSIS — F0280 Dementia in other diseases classified elsewhere without behavioral disturbance: Secondary | ICD-10-CM | POA: Diagnosis not present

## 2018-04-26 DIAGNOSIS — R079 Chest pain, unspecified: Secondary | ICD-10-CM

## 2018-04-26 DIAGNOSIS — I43 Cardiomyopathy in diseases classified elsewhere: Secondary | ICD-10-CM

## 2018-04-26 DIAGNOSIS — R0789 Other chest pain: Principal | ICD-10-CM | POA: Insufficient documentation

## 2018-04-26 DIAGNOSIS — N189 Chronic kidney disease, unspecified: Secondary | ICD-10-CM

## 2018-04-26 DIAGNOSIS — G3183 Dementia with Lewy bodies: Secondary | ICD-10-CM | POA: Diagnosis not present

## 2018-04-26 DIAGNOSIS — D649 Anemia, unspecified: Secondary | ICD-10-CM | POA: Diagnosis present

## 2018-04-26 MED ORDER — ONDANSETRON HCL 4 MG/2ML IJ SOLN: 4.0000 mg | Freq: Four times a day (QID) | INTRAMUSCULAR | Status: DC | PRN

## 2018-04-26 MED ORDER — MIRTAZAPINE 7.5 MG PO TABS
7.5000 mg | ORAL_TABLET | Freq: Every day | ORAL | Status: DC
  Administered 2018-04-26: 7.5 mg via ORAL
  Filled 2018-04-26: qty 1

## 2018-04-26 MED ORDER — QUETIAPINE FUMARATE 25 MG PO TABS
12.5000 mg | ORAL_TABLET | Freq: Every day | ORAL | Status: DC
  Administered 2018-04-26: 12.5 mg via ORAL
  Filled 2018-04-26: qty 1

## 2018-04-26 MED ORDER — SIMVASTATIN 20 MG PO TABS: 20.0000 mg | ORAL_TABLET | Freq: Every day | ORAL | Status: DC

## 2018-04-26 MED ORDER — TAMSULOSIN HCL 0.4 MG PO CAPS
0.4000 mg | ORAL_CAPSULE | Freq: Every day | ORAL | Status: DC
  Administered 2018-04-27: 0.4 mg via ORAL
  Filled 2018-04-26: qty 1

## 2018-04-26 MED ORDER — HEPARIN SODIUM (PORCINE) 5000 UNIT/ML IJ SOLN
5000.0000 [IU] | Freq: Three times a day (TID) | INTRAMUSCULAR | Status: DC
  Administered 2018-04-26 – 2018-04-27 (×2): 5000 [IU] via SUBCUTANEOUS
  Filled 2018-04-26 (×2): qty 1

## 2018-04-26 MED ORDER — ASPIRIN 81 MG PO CHEW
81.0000 mg | CHEWABLE_TABLET | Freq: Every day | ORAL | Status: DC
  Administered 2018-04-27: 81 mg via ORAL
  Filled 2018-04-26: qty 1

## 2018-04-26 MED ORDER — MEMANTINE HCL 5 MG PO TABS
10.0000 mg | ORAL_TABLET | Freq: Every day | ORAL | Status: DC
  Administered 2018-04-27: 10 mg via ORAL
  Filled 2018-04-26: qty 2

## 2018-04-26 MED ORDER — CLONAZEPAM 0.5 MG PO TABS
0.5000 mg | ORAL_TABLET | Freq: Every day | ORAL | Status: DC
  Administered 2018-04-26: 0.5 mg via ORAL
  Filled 2018-04-26: qty 1

## 2018-04-26 MED ORDER — SERTRALINE HCL 50 MG PO TABS
150.0000 mg | ORAL_TABLET | Freq: Every day | ORAL | Status: DC
  Administered 2018-04-27: 11:00:00 150 mg via ORAL
  Filled 2018-04-26: qty 1

## 2018-04-26 MED ORDER — ACETAMINOPHEN 325 MG PO TABS: 650.0000 mg | ORAL_TABLET | ORAL | Status: DC | PRN

## 2018-04-26 MED ORDER — TRAZODONE HCL 50 MG PO TABS
50.0000 mg | ORAL_TABLET | Freq: Every day | ORAL | Status: DC
  Administered 2018-04-26: 50 mg via ORAL
  Filled 2018-04-26: qty 1

## 2018-04-26 MED ORDER — FOLIC ACID 1 MG PO TABS
1.0000 mg | ORAL_TABLET | Freq: Every day | ORAL | Status: DC
Start: 2018-04-27 — End: 2018-04-27
  Administered 2018-04-27: 1 mg via ORAL
  Filled 2018-04-26: qty 1

## 2018-04-26 MED ORDER — FAMOTIDINE 20 MG PO TABS
20.0000 mg | ORAL_TABLET | Freq: Two times a day (BID) | ORAL | Status: DC
  Administered 2018-04-26 – 2018-04-27 (×2): 20 mg via ORAL
  Filled 2018-04-26 (×2): qty 1

## 2018-04-26 MED ORDER — RAMELTEON 8 MG PO TABS
8.0000 mg | ORAL_TABLET | Freq: Every day | ORAL | Status: DC
  Filled 2018-04-26 (×2): qty 1

## 2018-04-26 NOTE — Plan of Care (Signed)
  Problem: Education: Goal: Knowledge of General Education information will improve Description Including pain rating scale, medication(s)/side effects and non-pharmacologic comfort measures Outcome: Progressing   

## 2018-04-26 NOTE — Progress Notes (Signed)
Patient has arrived to unit, brought by EMS, denying chest pain.  Cardiology paged with this information via Amion.

## 2018-04-26 NOTE — H&P (Signed)
Cardiology History & Physical    Patient ID: Brett Mitchell MRN: [id number], DOB: 04-Jun-1941 Date of Encounter: 04/26/2018, 11:08 PM Primary Physician: Patient, No Pcp Per  Chief Complaint: Chest pain   HPI: Brett Mitchell is a 77 y.o. male with history of Lewy body dementia, black lung disease, CKD, who presents transfer from outside hospital for consideration of cardiac catheterization.  Patient was in his usual state of health, which is quite active despite his advancing Lewy body dementia, until a little over a week ago when he had the onset of upper respiratory symptoms.  He was treated with a course of steroids and antibiotics with symptomatic improvement.  Over the weekend, he had 2 discrete episodes of chest discomfort, which the patient cannot recall at this time.  Per report from a family member who unfortunately was not accompanying him at the time of his chest pain, the episode were not with exertion.  He has had no recurrences of his chest pain in the interim.  Since he had these episodes, he was evaluated in the emergency department at [facility] in [place] on 7/14.  There, he he ruled out with serial troponins.  He was admitted for observation, and underwent an echocardiogram that showed an EF of 45 to 50%, with mild hypokinesis of the LV apex, septum and inferior wall.  There were no other major valvular abnormalities.  He subsequently underwent a Lexiscan nuclear myocardial perfusion imaging study, which showed a resting EF of 43%, which increased to 52% with stress, and showed no perfusion defects suggestive of ischemia.  However, cardiology consult at the outside hospital felt the patient deserves a cardiac catheterization for further event evaluation of mildly depressed EF.  He was then transferred to [facility] for further work-up and management.  Upon arrival here, the patient feels well, denies any chest pains, shortness of breath, PND, orthopnea or lower  extremity edema.  Past Medical History:  Diagnosis Date  . Arthritis   . Black lung disease (HCC)   . Dementia   . Depression    situational  . Dyspnea    with exertion  . Heart murmur    as a child  . History of kidney stones    passed some- "still has some"  . Hyperlipemia      Surgical History:  Past Surgical History:  Procedure Laterality Date  . KNEE ARTHROPLASTY Left   . NO PAST SURGERIES    . SCLERAL BUCKLE Right 10/26/2016   Procedure: SCLERAL BUCKLE, C3F8 INJECTION, RIGHT EYE;  Surgeon: Sherrie George, MD;  Location: [facility] OR;  Service: Ophthalmology;  Laterality: Right;     Home Meds: Flomax 0.4 mg daily Seroquel 12.5 mg nightly Clonazepam 0.5 mg nightly Simvastatin 20 mg daily Ranitidine 150 mg twice daily Folate 1 mg daily Memantine 10 mg daily Vascepa 1 g twice daily Sertraline 150 mg daily Melatonin 3 mg nightly Trazodone 50 mg nightly Mirtazapine 7.5 mg nightly  Allergies:  Allergies  Allergen Reactions  . Other Swelling    Bee  Stings    Social History   Socioeconomic History  . Marital status: Widowed    Spouse name: Not on file  . Number of children: Not on file  . Years of education: Not on file  . Highest education level: Not on file  Occupational History  . Not on file  Social Needs  . Financial resource strain: Not on file  . Food insecurity:  Worry: Not on file    Inability: Not on file  . Transportation needs:    Medical: Not on file    Non-medical: Not on file  Tobacco Use  . Smoking status: Never Smoker  . Smokeless tobacco: Former Neurosurgeon    Types: Snuff  . Tobacco comment: 10/22/15- Quit using snuff > 10 years ago  Substance and Sexual Activity  . Alcohol use: No  . Drug use: No  . Sexual activity: Not on file  Lifestyle  . Physical activity:    Days per week: Not on file    Minutes per session: Not on file  . Stress: Not on file  Relationships  . Social connections:    Talks on phone: Not on file    Gets  together: Not on file    Attends religious service: Not on file    Active member of club or organization: Not on file    Attends meetings of clubs or organizations: Not on file    Relationship status: Not on file  . Intimate partner violence:    Fear of current or ex partner: Not on file    Emotionally abused: Not on file    Physically abused: Not on file    Forced sexual activity: Not on file  Other Topics Concern  . Not on file  Social History Narrative  . Not on file     Family History  Problem Relation Age of Onset  . Asthma Mother     Review of Systems: All other systems reviewed and are otherwise negative except as noted above.  Labs:   -Unable to find BMP in OSH records  -CBC: 8.3 > 13.5/38.6 < 191 -AST 68, remainder of LFTs WNL -Troponin negative x2 (last 7/15 @ 6AM)  Radiology/Studies:  No results found. Wt Readings from Last 3 Encounters:  04/26/18 61.1 kg (134 lb 9.6 oz)  10/21/16 71.9 kg (158 lb 9.6 oz)    EKG: NSR, STD in V3-V6, new compared to prior ECGs (presenting ECG at OSH).  Physical Exam: Blood pressure 116/73, pulse 74, temperature 98.5 F (36.9 C), temperature source Oral, resp. rate 18, height 5\' 10"  (1.778 m), weight 61.1 kg (134 lb 9.6 oz), SpO2 100 %. Body mass index is 19.31 kg/m. General: Pleasant elderly man Head: Normocephalic, atraumatic, sclera non-icteric, no xanthomas, nares are without discharge.  Neck: Negative for carotid bruits. JVD not elevated. Lungs: Clear bilaterally to auscultation without wheezes, rales, or rhonchi. Breathing is unlabored. Heart: RRR with S1 S2. No murmurs, rubs, or gallops appreciated. Abdomen: Soft, non-tender, non-distended with normoactive bowel sounds. No hepatomegaly. No rebound/guarding. No obvious abdominal masses. Msk:  Strength and tone appear normal for age. Extremities: No clubbing or cyanosis. No edema.  Distal pedal pulses are 2+ and equal bilaterally. Neuro: Pleasant, but unable to recall  his history in any detail.  Otherwise, able to answer questions appropriately. Psych:  Responds to questions appropriately with a normal affect.    Assessment and Plan  77 year old man, who presents with 2 episodes of chest pain, found to have a mildly depressed EF and no reversible ischemia on Lexiscan nuclear study.  1.  Chest pain: His echo showed mildly depressed EF, however there were no perfusion defects suggestive of ischemia on nuclear study.  His ST depression and T wave inversion in the anterior and apical leads is impressive.  Per his family members, he is very active despite his advancing dementia.  He wishes for aggressive care at this time.  As such, it would not be unreasonable to proceed towards cardiac catheterization, although his ischemic evaluation has been reassuring up until this point.  Awaiting basic metabolic panel to check renal function before deciding on this.  2.  Hyperlipidemia: Checking lipid panel, continue home simvastatin for now.  Consider transitioning to high intensity statin based on results.  Home Vascepa is on hold as it is not available on formulary.  3.  CKD: Per report, unclear baseline.  Basic metabolic panel ordered upon admission.  4.  Lewy body dementia: Continue home memantine.  5.  Depression: Continue home sertraline, mirtazapine, trazodone, clonazepam.  6.  CODE STATUS: Full code, confirmed.  Signed, Esmond Plants, MD 04/26/2018, 11:08 PM

## 2018-04-27 ENCOUNTER — Telehealth: Payer: Self-pay | Admitting: Internal Medicine

## 2018-04-27 DIAGNOSIS — D649 Anemia, unspecified: Secondary | ICD-10-CM | POA: Diagnosis present

## 2018-04-27 DIAGNOSIS — D631 Anemia in chronic kidney disease: Secondary | ICD-10-CM

## 2018-04-27 DIAGNOSIS — N183 Chronic kidney disease, stage 3 unspecified: Secondary | ICD-10-CM

## 2018-04-27 DIAGNOSIS — I428 Other cardiomyopathies: Secondary | ICD-10-CM

## 2018-04-27 DIAGNOSIS — I43 Cardiomyopathy in diseases classified elsewhere: Secondary | ICD-10-CM

## 2018-04-27 DIAGNOSIS — J60 Coalworker's pneumoconiosis: Secondary | ICD-10-CM

## 2018-04-27 DIAGNOSIS — F0280 Dementia in other diseases classified elsewhere without behavioral disturbance: Secondary | ICD-10-CM

## 2018-04-27 DIAGNOSIS — G3183 Dementia with Lewy bodies: Secondary | ICD-10-CM

## 2018-04-27 DIAGNOSIS — R079 Chest pain, unspecified: Secondary | ICD-10-CM

## 2018-04-27 DIAGNOSIS — R0789 Other chest pain: Secondary | ICD-10-CM | POA: Diagnosis not present

## 2018-04-27 LAB — BASIC METABOLIC PANEL
Anion gap: 8 (ref 5–15)
BUN: 13 mg/dL (ref 8–23)
CO2: 19 mmol/L — ABNORMAL LOW (ref 22–32)
Calcium: 8.3 mg/dL — ABNORMAL LOW (ref 8.9–10.3)
Chloride: 114 mmol/L — ABNORMAL HIGH (ref 98–111)
Creatinine, Ser: 1.46 mg/dL — ABNORMAL HIGH (ref 0.61–1.24)
GFR calc Af Amer: 52 mL/min — ABNORMAL LOW (ref >60)
GFR calc non Af Amer: 45 mL/min — ABNORMAL LOW (ref >60)
Glucose, Bld: 109 mg/dL — ABNORMAL HIGH (ref 70–99)
Potassium: 4.1 mmol/L (ref 3.5–5.1)
Sodium: 141 mmol/L (ref 135–145)

## 2018-04-27 LAB — CBC
HCT: 30.5 % — ABNORMAL LOW (ref 39.0–52.0)
Hemoglobin: 10.2 g/dL — ABNORMAL LOW (ref 13.0–17.0)
MCH: 34.5 pg — ABNORMAL HIGH (ref 26.0–34.0)
MCHC: 33.4 g/dL (ref 30.0–36.0)
MCV: 103 fL — ABNORMAL HIGH (ref 78.0–100.0)
Platelets: 122 10*3/uL — ABNORMAL LOW (ref 150–400)
RBC: 2.96 MIL/uL — ABNORMAL LOW (ref 4.22–5.81)
RDW: 13.2 % (ref 11.5–15.5)
WBC: 8.1 10*3/uL (ref 4.0–10.5)

## 2018-04-27 LAB — IRON AND TIBC
IRON: 87 ug/dL (ref 45–182)
Saturation Ratios: 57 % — ABNORMAL HIGH (ref 17.9–39.5)
TIBC: 153 ug/dL — ABNORMAL LOW (ref 250–450)
UIBC: 66 ug/dL

## 2018-04-27 LAB — LIPID PANEL
Cholesterol: 110 mg/dL (ref 0–200)
HDL: 44 mg/dL (ref >40)
LDL Cholesterol: 43 mg/dL (ref 0–99)
Total CHOL/HDL Ratio: 2.5 RATIO
Triglycerides: 113 mg/dL (ref <150)
VLDL: 23 mg/dL (ref 0–40)

## 2018-04-27 LAB — FERRITIN: Ferritin: 279 ng/mL (ref 24–336)

## 2018-04-27 LAB — TROPONIN I: Troponin I: <0.03 ng/mL (ref <0.03)

## 2018-04-27 LAB — PROTIME-INR
INR: 1.16
Prothrombin Time: 14.7 seconds (ref 11.4–15.2)

## 2018-04-27 MED ORDER — NITROGLYCERIN 0.4 MG SL SUBL: 0.4000 mg | SUBLINGUAL_TABLET | SUBLINGUAL | 3 refills | Status: AC | PRN

## 2018-04-27 MED ORDER — ASPIRIN 81 MG PO CHEW: 81.0000 mg | CHEWABLE_TABLET | Freq: Every day | ORAL | 3 refills | Status: AC

## 2018-04-27 NOTE — Telephone Encounter (Signed)
New Message   [facility] appointment made on 05/11/2018 at 9:30 am with Joni Reining per Dot Lanes

## 2018-04-27 NOTE — Progress Notes (Signed)
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 Transferred for possible cath.  Subjective   Mr. Bonneau denies any further chest pain. 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 I discussed my concerns about cath with him and his daughter - his daughter is also concerned about proceeding with cath given dementia, CKD and unexplained anemia.   Objective   Vitals:   04/26/18 2202 04/27/18 0445  BP: 116/73 (!) 128/56  Pulse: 74 (!) 54  Resp: 18 18  Temp: 98.5 F (36.9 C) 98.3 F (36.8 C)  TempSrc: Oral Oral  SpO2: 100% 99%  Weight: 134 lb 9.6 oz (61.1 kg) 134 lb (60.8 kg)  Height: _0  (1.778 m)    No intake or output data in the 24 hours ending 04/27/18 1031 Filed Weights   04/26/18 2202 04/27/18 0445  Weight: 134 lb 9.6 oz (61.1 kg) 134 lb (60.8 kg)    Physical Exam   General appearance: alert and no distress Lungs: clear to auscultation bilaterally Heart: regular rate and rhythm Extremities: extremities normal, atraumatic, no cyanosis or edema Neurologic: Mental status: Awake, mildly confused, follows commands  Inpatient Medications    Scheduled Meds: . aspirin  81 mg Oral Daily  . clonazePAM  0.5 mg Oral QHS  . famotidine  20 mg Oral BID  . folic acid  1 mg Oral Daily  . heparin  5,000 Units Subcutaneous Q8H  . memantine  10 mg Oral Daily  . mirtazapine  7.5 mg Oral QHS  . QUEtiapine  12.5 mg Oral QHS  . ramelteon  8 mg Oral QHS  . sertraline  150 mg Oral Daily  . simvastatin  20 mg Oral q1800  . tamsulosin  0.4 mg Oral Daily  . traZODone  50 mg Oral QHS    Continuous Infusions:   PRN Meds: acetaminophen, ondansetron (ZOFRAN) IV   Labs   Results for orders placed  or performed during the hospital encounter of 04/26/18 (from the past 48 hour(s))  Troponin I-serum (one time only)     Status: None   Collection Time: 04/26/18 11:55 PM  Result Value Ref Range   Troponin I <0.03 <0.03 ng/mL    Comment: Performed at [facility], [place] [address], [place], [place] [address]  CBC     Status: Abnormal   Collection Time: 04/26/18 11:55 PM  Result Value Ref Range   WBC 8.1 4.0 - 10.5 K/uL   RBC 2.96 (L) 4.22 - 5.81 MIL/uL   Hemoglobin 10.2 (L) 13.0 - 17.0 g/dL   HCT 30.5 (L) 39.0 - 52.0 %   MCV 103.0 (H) 78.0 - 100.0 fL   MCH 34.5 (H) 26.0 - 34.0 pg   MCHC 33.4 30.0 - 36.0 g/dL   RDW 13.2 11.5 - 15.5 %   Platelets 122 (L) 150 - 400 K/uL    Comment: Performed at [facility], [place] [address], [place], [place] [address]  Basic metabolic panel     Status: Abnormal   Collection Time: 04/26/18 11:55 PM  Result Value Ref Range   Sodium 141 135 - 145 mmol/L   Potassium 4.1 3.5 - 5.1 mmol/L   Chloride 114 (H) 98 - 111 mmol/L  Comment: Please note change in reference range.   CO2 19 (L) 22 - 32 mmol/L   Glucose, Bld 109 (H) 70 - 99 mg/dL    Comment: Please note change in reference range.   BUN 13 8 - 23 mg/dL    Comment: Please note change in reference range.   Creatinine, Ser 1.46 (H) 0.61 - 1.24 mg/dL   Calcium 8.3 (L) 8.9 - 10.3 mg/dL   GFR calc non Af Amer 45 (L) >60 mL/min   GFR calc Af Amer 52 (L) >60 mL/min    Comment: (NOTE) The eGFR has been calculated using the CKD EPI equation. This calculation has not been validated in all clinical situations. eGFR's persistently <60 mL/min signify possible Chronic Kidney Disease.    Anion gap 8 5 - 15    Comment: Performed at [facility] [address] [place], [place] [address]  Lipid panel     Status: None   Collection Time: 04/26/18 11:55 PM  Result Value Ref Range   Cholesterol 110 0 - 200 mg/dL   Triglycerides 113 <150 mg/dL   HDL 44 >40 mg/dL   Total CHOL/HDL Ratio 2.5 RATIO     VLDL 23 0 - 40 mg/dL   LDL Cholesterol 43 0 - 99 mg/dL    Comment:        Total Cholesterol/HDL:CHD Risk Coronary Heart Disease Risk Table                     Men   Women  1/2 Average Risk   3.4   3.3  Average Risk       5.0   4.4  2 X Average Risk   9.6   7.1  3 X Average Risk  23.4   11.0        Use the calculated Patient Ratio above and the CHD Risk Table to determine the patient's CHD Risk.        ATP III CLASSIFICATION (LDL):  <100     mg/dL   Optimal  100-129  mg/dL   Near or Above                    Optimal  130-159  mg/dL   Borderline  160-189  mg/dL   High  >190     mg/dL   Very High Performed at [facility] [address] [place], [place] [address]   Protime-INR     Status: None   Collection Time: 04/26/18 11:55 PM  Result Value Ref Range   Prothrombin Time 14.7 11.4 - 15.2 seconds   INR 1.16     Comment: Performed at [facility] [address] [place], [place] [address]    ECG   Sinus bradycardia at 57 - Personally Reviewed  Telemetry   Sinus rhythm- Personally Reviewed  Radiology    No results found.  Cardiac Studies   N/A  Assessment   Active Problems:   Chest pain   NICM (nonischemic cardiomyopathy) (HCC)   Anemia   Plan   1. Mr. Baca denies any further chest pain. He is not an ideal cath candidate. Family does not to proceed with cath. His myoview was non-ischemic. Ruled-out for ACS at OSH. Would recommend medical therapy. Continue aspirin 81 mg and simvastatin 20 mg. Not on BB due to bradycardia - renal function will not allow ACE-I/ARB. Can use nitro PRN for chest pain. Check iron studies and replete iron  if trans sat <25%. Will need outpatient nephrology referral to [facility] - may need EPO. [place] for d/c home later today. Ok to eat.  Time Spent Directly with Patient:  I have spent a total of 25 minutes with the patient reviewing hospital notes, telemetry, EKGs, labs and examining the patient as well  as establishing an assessment and plan that was discussed personally with the patient.  > 50% of time was spent in direct patient care.  Length of Stay:  LOS: 1 day   Pixie Casino, MD, [facility], [place] Director of the Advanced Lipid Disorders &  Cardiovascular Risk Reduction Clinic Diplomate of the American Board of Clinical Lipidology Attending Cardiologist  Direct Dial: [phone]  Fax: [phone]  Website:  [email or web address] [email or web address] Mount 04/27/2018, 10:31 AM

## 2018-04-27 NOTE — Discharge Summary (Signed)
Discharge Summary    Patient ID: Brett Mitchell,  MRN: [id number], DOB/AGE: Jul 13, 1941 77 y.o.  Admit date: 04/26/2018 Discharge date: 04/27/2018  Primary Care Provider: Patient, No Pcp Per Primary Cardiologist: No primary care provider on file.  Discharge Diagnoses    Principal Problem:   Chest pain Active Problems:   NICM (nonischemic cardiomyopathy) (HCC)   Anemia   Lewy body dementia   CKD (chronic kidney disease) stage 3, GFR 30-59 ml/min (HCC)   Black lung disease (HCC)   Allergies Allergies  Allergen Reactions  . Other Swelling    Bee  Stings    Diagnostic Studies/Procedures    None _____________   History of Present Illness     77 y.o. male with history of Lewy body dementia, black lung disease, CKD, who presents transfer from outside hospital for consideration of cardiac catheterization.  Patient was in his usual state of health, which is quite active despite his advancing Lewy body dementia, until a little over a week ago when he had the onset of upper respiratory symptoms.  He was treated with a course of steroids and antibiotics with symptomatic improvement.  Over the weekend, he had 2 discrete episodes of chest discomfort, which the patient cannot recall at this time.  Per report from a family member who unfortunately was not accompanying him at the time of his chest pain, the episode were not with exertion.  He has had no recurrences of his chest pain in the interim.  Since he had these episodes, he was evaluated in the emergency department at [facility] in [place] on 7/14.  There, he he ruled out with serial troponins.  He was admitted for observation, and underwent an echocardiogram that showed an EF of 45 to 50%, with mild hypokinesis of the LV apex, septum and inferior wall.  There were no other major valvular abnormalities.  He subsequently underwent a Lexiscan nuclear myocardial perfusion imaging study, which showed a resting EF of 43%, which  increased to 52% with stress, and showed no perfusion defects suggestive of ischemia.  However, cardiology consult at the outside hospital felt the patient deserves a cardiac catheterization for further event evaluation of mildly depressed EF.  He was then transferred to [facility] for further work-up and management.  Upon arrival to [facility], the patient felt well, denied any chest pains, shortness of breath, PND, orthopnea or lower extremity edema.    Hospital Course     Consultants: None   1. Chest pain: patient had an extensive work-up at a hospital in [place], [place] including negative troponins and a negative NST. He was noted to have a mildly reduced EF of 45-50%. Based on new cardiomyopathy, cardiologist in [place] thought he would benefit from a LHC to further evaluate ischemic causes, therefore he was transferred to [facility] for further evaluation. After discussion with family and review of labs/ testing results (negative trops, negative NST, Cr 1.45), decision was made to defer LHC at this time.  - Continue ASA and statin; prn SL nitro for recurring CP  2. Non-ischemic cardiomyopathy: Noted to have an EF of 45% on echo for CP work-up. NST without ischemia. Patient was felt to be a poor candidate for invasive ischemic testing at this time. Unable to add BBlocker due to bradycardia or ACEi/ARB due to CKD. Patient appeared euvolemic on exam. - Continue close monitoring of volume status  3. CKD stage IV: Cr 1.45 on the day of discharge.  - Referral  placed in epic for outpatient nephrology evaluation - Patient provided with contact information to schedule an appointment with [facility] _____________  Discharge Vitals Blood pressure 130/67, pulse (!) 58, temperature 97.7 F (36.5 C), temperature source Oral, resp. rate 18, height 5\' 10"  (1.778 m), weight 134 lb (60.8 kg), SpO2 100 %.  Filed Weights   04/26/18 2202 04/27/18 0445  Weight: 134 lb 9.6 oz (61.1 kg) 134 lb (60.8 kg)     Labs & Radiologic Studies    CBC Recent Labs    04/26/18 2355  WBC 8.1  HGB 10.2*  HCT 30.5*  MCV 103.0*  PLT 122*   Basic Metabolic Panel Recent Labs    16/10/96 2355  NA 141  K 4.1  CL 114*  CO2 19*  GLUCOSE 109*  BUN 13  CREATININE 1.46*  CALCIUM 8.3*   Liver Function Tests No results for input(s): AST, ALT, ALKPHOS, BILITOT, PROT, ALBUMIN in the last 72 hours. No results for input(s): LIPASE, AMYLASE in the last 72 hours. Cardiac Enzymes Recent Labs    04/26/18 2355  TROPONINI <0.03   BNP Invalid input(s): POCBNP D-Dimer No results for input(s): DDIMER in the last 72 hours. Hemoglobin A1C No results for input(s): HGBA1C in the last 72 hours. Fasting Lipid Panel Recent Labs    04/26/18 2355  CHOL 110  HDL 44  LDLCALC 43  TRIG 113  CHOLHDL 2.5   Thyroid Function Tests No results for input(s): TSH, T4TOTAL, T3FREE, THYROIDAB in the last 72 hours.  Invalid input(s): FREET3 _____________  No results found. Disposition   Patient was seen and examined by Dr. Rennis Golden who deemed patient as stable for discharge. Follow-up has been arranged. Discharge medications as listed below.   Follow-up Plans & Appointments    Follow-up Information    [facility] Follow up.   Why:  Please call to schedule an appointment to be seen within 1 month of discharge Contact information: [address] [place] [address] [phone]        Jodelle Gross, NP Follow up on 05/11/2018.   Specialties:  Nurse Practitioner, Radiology, Cardiology Why:  Please arrive 15 minutes early for your 9:30am post-hospital cardiology appointment Contact information: [address] [place] [address] [phone]          Discharge Instructions    Ambulatory referral to Nephrology   Complete by:  As directed       Discharge Medications   Allergies as of 04/27/2018      Reactions   Other Swelling   Bee  Stings      Medication List    STOP  taking these medications   bacitracin-polymyxin b ophthalmic ointment Commonly known as:  POLYSPORIN   gatifloxacin 0.5 % Soln Commonly known as:  ZYMAXID   ibuprofen 800 MG tablet Commonly known as:  ADVIL,MOTRIN   oxyCODONE 10 mg 12 hr tablet Commonly known as:  OXYCONTIN   Oxycodone HCl 10 MG Tabs   prednisoLONE acetate 1 % ophthalmic suspension Commonly known as:  PRED FORTE     TAKE these medications   aspirin 81 MG chewable tablet Chew 1 tablet (81 mg total) by mouth daily. Start taking on:  04/28/2018   benzonatate 200 MG capsule Commonly known as:  TESSALON Take 200 mg by mouth 3 (three) times daily as needed for cough.   BREO ELLIPTA 100-25 MCG/INH Aepb Generic drug:  fluticasone furoate-vilanterol Inhale 1 puff into the lungs 2 (two) times daily.  clonazePAM 0.5 MG tablet Commonly known as:  KLONOPIN Take 0.5 mg by mouth at bedtime.   fluticasone 50 MCG/ACT nasal spray Commonly known as:  FLONASE Place 2 sprays into both nostrils daily as needed for allergies.   folic acid 1 MG tablet Commonly known as:  FOLVITE Take 1 mg by mouth daily.   MELATONIN PO Take 8 mg by mouth at bedtime.   memantine 10 MG tablet Commonly known as:  NAMENDA Take 10 mg by mouth daily.   mirtazapine 7.5 MG tablet Commonly known as:  REMERON Take 7.5 mg by mouth at bedtime.   nitroGLYCERIN 0.4 MG SL tablet Commonly known as:  NITROSTAT Place 1 tablet (0.4 mg total) under the tongue every 5 (five) minutes as needed for chest pain.   QUEtiapine 25 MG tablet Commonly known as:  SEROQUEL Take 12.25 mg by mouth at bedtime.   ranitidine 150 MG tablet Commonly known as:  ZANTAC Take 150 mg by mouth 2 (two) times daily.   sertraline 100 MG tablet Commonly known as:  ZOLOFT Take 150 mg by mouth daily.   simvastatin 20 MG tablet Commonly known as:  ZOCOR Take 20 mg by mouth daily.   tamsulosin 0.4 MG Caps capsule Commonly known as:  FLOMAX Take 0.4 mg by mouth  daily.   traZODone 50 MG tablet Commonly known as:  DESYREL Take 50 mg by mouth at bedtime.   valACYclovir 500 MG tablet Commonly known as:  VALTREX Take 500 mg by mouth daily.   VASCEPA 1 g Caps Generic drug:  Icosapent Ethyl Take 1 g by mouth 2 (two) times daily.   VENTOLIN HFA 108 (90 Base) MCG/ACT inhaler Generic drug:  albuterol Inhale 2 puffs into the lungs every 4 (four) hours as needed for wheezing or shortness of breath.         Outstanding Labs/Studies   None  Duration of Discharge Encounter   Greater than 30 minutes including physician time.  Signed, Beatriz Stallion PA-C 04/27/2018, 2:56 PM

## 2018-04-28 DIAGNOSIS — I200 Unstable angina: Secondary | ICD-10-CM | POA: Insufficient documentation

## 2018-04-28 LAB — SOLUBLE TRANSFERRIN RECEPTOR: TRANSFERRIN RECEPTOR: 13.7 nmol/L (ref 12.2–27.3)

## 2018-05-01 NOTE — Telephone Encounter (Signed)
The pt is following up in our [place] office. Will forward this message to that office. 

## 2018-05-01 NOTE — Telephone Encounter (Signed)
Patient contacted regarding discharge from [facility] on 04/27/18.   Patient understands to follow up with provider Joni Reining on 05/11/18 at 930am at [facility]. Patient understands discharge instructions? yes Patient understands medications and regiment? yes Patient understands to bring all medications to this visit? yes

## 2018-05-06 ENCOUNTER — Other Ambulatory Visit: Payer: Self-pay

## 2018-05-06 ENCOUNTER — Emergency Department (HOSPITAL_COMMUNITY): Payer: Medicare Other

## 2018-05-06 ENCOUNTER — Encounter (HOSPITAL_COMMUNITY): Payer: Self-pay

## 2018-05-06 ENCOUNTER — Emergency Department (HOSPITAL_COMMUNITY)
Admission: EM | Admit: 2018-05-06 | Discharge: 2018-05-06 | Disposition: A | Discharge status: Home / Self Care | Payer: Medicare Other | Attending: Emergency Medicine | Admitting: Emergency Medicine

## 2018-05-06 DIAGNOSIS — Z79899 Other long term (current) drug therapy: Secondary | ICD-10-CM | POA: Diagnosis not present

## 2018-05-06 DIAGNOSIS — R531 Weakness: Secondary | ICD-10-CM | POA: Insufficient documentation

## 2018-05-06 DIAGNOSIS — F0390 Unspecified dementia without behavioral disturbance: Secondary | ICD-10-CM | POA: Diagnosis not present

## 2018-05-06 DIAGNOSIS — N183 Chronic kidney disease, stage 3 (moderate): Secondary | ICD-10-CM | POA: Insufficient documentation

## 2018-05-06 LAB — CBC
HCT: 35.2 % — ABNORMAL LOW (ref 39.0–52.0)
Hemoglobin: 12 g/dL — ABNORMAL LOW (ref 13.0–17.0)
MCH: 35 pg — ABNORMAL HIGH (ref 26.0–34.0)
MCHC: 34.1 g/dL (ref 30.0–36.0)
MCV: 102.6 fL — ABNORMAL HIGH (ref 78.0–100.0)
PLATELETS: 131 10*3/uL — AB (ref 150–400)
RBC: 3.43 MIL/uL — ABNORMAL LOW (ref 4.22–5.81)
RDW: 13.5 % (ref 11.5–15.5)
WBC: 4.6 10*3/uL (ref 4.0–10.5)

## 2018-05-06 LAB — URINALYSIS, ROUTINE W REFLEX MICROSCOPIC
Bacteria, UA: NONE SEEN
Bilirubin Urine: NEGATIVE
GLUCOSE, UA: NEGATIVE mg/dL
Ketones, ur: 5 mg/dL — AB
LEUKOCYTES UA: NEGATIVE
NITRITE: NEGATIVE
PH: 5 (ref 5.0–8.0)
Protein, ur: NEGATIVE mg/dL
SPECIFIC GRAVITY, URINE: 1.016 (ref 1.005–1.030)

## 2018-05-06 LAB — BASIC METABOLIC PANEL
Anion gap: 11 (ref 5–15)
BUN: 24 mg/dL — AB (ref 8–23)
CALCIUM: 9.2 mg/dL (ref 8.9–10.3)
CO2: 20 mmol/L — ABNORMAL LOW (ref 22–32)
Chloride: 106 mmol/L (ref 98–111)
Creatinine, Ser: 1.88 mg/dL — ABNORMAL HIGH (ref 0.61–1.24)
GFR calc Af Amer: 38 mL/min — ABNORMAL LOW (ref >60)
GFR calc non Af Amer: 33 mL/min — ABNORMAL LOW (ref >60)
GLUCOSE: 94 mg/dL (ref 70–99)
POTASSIUM: 4.1 mmol/L (ref 3.5–5.1)
SODIUM: 137 mmol/L (ref 135–145)

## 2018-05-06 LAB — CBG MONITORING, ED: Glucose-Capillary: 81 mg/dL (ref 70–99)

## 2018-05-06 MED ORDER — SODIUM CHLORIDE 0.9 % IV BOLUS
500.0000 mL | Freq: Once | INTRAVENOUS | Status: AC
  Administered 2018-05-06: 500 mL via INTRAVENOUS

## 2018-05-06 MED ORDER — SODIUM CHLORIDE 0.9 % IV SOLN
Freq: Once | INTRAVENOUS | Status: AC
  Administered 2018-05-06: 22:00:00 via INTRAVENOUS

## 2018-05-06 NOTE — Discharge Instructions (Addendum)
Follow up with local provider as discussed. Return to ER for any concerns.

## 2018-05-06 NOTE — ED Provider Notes (Signed)
[facility] EMERGENCY DEPARTMENT Provider Note   CSN: [id number] Arrival date & time: 05/06/18  1520     History   Chief Complaint Chief Complaint  Patient presents with  . Weakness    HPI Brett Mitchell is a 77 y.o. male.  77 year old male presents from home, brought in by his daughter for generalized weakness.  Patient was recently admitted July 17-19 for cardiac evaluation after transfer from hospital in [place] to this hospital for consideration for cardiac catheterization.  Patient's daughter is at bedside, family felt that cardiac catheterization recommended by hospitalist [place] was not necessary and had patient transferred here for second opinion.  Patient was discharged home and has been staying with his daughter.  Daughter states for the past 3 days patient has been progressively more weak, hallucinating (reports seeing people in the room or seeing a ball on the floor, nonaggressive).  Daughter states that patient was fidgety this morning and needed assistance to the bathroom however he was so weak that patient's daughter and grandson struggled to get him to and from the bathroom.  Daughter states he has voided one time since this early morning void, has not had a bowel movement for the past several days, is only had one cherry to eat today, reports significant decrease in p.o. intake.  Also states patient's voice is hoarse and patient has been more irritated/agitated than normal.  Patient has a history of Lewy body dementia. No other complaints or concerns.      Past Medical History:  Diagnosis Date  . Arthritis   . Black lung disease (HCC)   . Dementia   . Depression    situational  . Dyspnea    with exertion  . Heart murmur    as a child  . History of kidney stones    passed some- "still has some"  . Hyperlipemia   . Unstable angina [facility])     Patient Active Problem List   Diagnosis Date Noted  . Unstable angina (HCC)   . 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 Anemia 04/27/2018  . 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 CKD (chronic kidney disease) stage 3, GFR 30-59 ml/min (HCC) 04/27/2018  . 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 Chest pain 04/26/2018  . 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 KNEE ARTHROPLASTY Left   . NO PAST SURGERIES    . SCLERAL BUCKLE Right 10/26/2016   Procedure: SCLERAL BUCKLE, C3F8 INJECTION, RIGHT EYE;  Surgeon: Sherrie George, MD;  Location: [facility] OR;  Service: Ophthalmology;  Laterality: Right;        Home Medications    Prior to Admission medications   Medication Sig Start Date End Date Taking? Authorizing Provider  albuterol (PROVENTIL HFA;VENTOLIN HFA) 108 (90 Base) MCG/ACT inhaler Inhale 2 puffs into the lungs every 4 (four) hours as needed for wheezing or shortness of breath.   Yes [provider]  aspirin 81 MG chewable tablet Chew 1 tablet (81 mg total) by mouth daily. 04/28/18  Yes Kroeger, Dot Lanes M., PA-C  benzonatate (TESSALON) 200 MG capsule Take 200 mg by mouth 3 (three) times daily as needed for cough. 04/16/18  Yes [provider]  clonazePAM (KLONOPIN) 0.5 MG tablet Take 0.5 mg by mouth at bedtime.  09/19/16  Yes [provider]  fluticasone (FLONASE) 50 MCG/ACT nasal spray Place 2 sprays into both nostrils daily as needed for allergies.  04/16/18  Yes [provider]  fluticasone furoate-vilanterol (BREO ELLIPTA) 100-25 MCG/INH AEPB Inhale 1 puff into the lungs 2 (two) times daily as needed (shortness of breath).   Yes [provider]  folic acid (FOLVITE) 1 MG tablet Take 1 mg by mouth daily.   Yes [provider]  Icosapent Ethyl (VASCEPA) 1 g CAPS Take 1 g by mouth 2 (two) times daily.   Yes [provider]  MELATONIN PO Take 8 mg by mouth at bedtime.   Yes [provider]  memantine (NAMENDA) 10 MG tablet  Take 10 mg by mouth daily. 04/21/18  Yes [provider]  mirtazapine (REMERON) 7.5 MG tablet Take 7.5 mg by mouth at bedtime.   Yes [provider]  nitroGLYCERIN (NITROSTAT) 0.4 MG SL tablet Place 1 tablet (0.4 mg total) under the tongue every 5 (five) minutes as needed for chest pain. 04/27/18 04/27/19 Yes Kroeger, Ovidio Kin., PA-C  oxyCODONE (OXYCONTIN) 10 mg 12 hr tablet Take 10 mg by mouth 2 (two) times daily as needed (pain).   Yes [provider]  Oxycodone HCl 10 MG TABS Take 10 mg by mouth 3 (three) times daily as needed (pain).   Yes [provider]  QUEtiapine (SEROQUEL) 25 MG tablet Take 12.5 mg by mouth at bedtime.    Yes [provider]  ranitidine (ZANTAC) 150 MG tablet Take 150 mg by mouth 2 (two) times daily. 10/01/16  Yes [provider]  sertraline (ZOLOFT) 100 MG tablet Take 150 mg by mouth daily. 09/10/16  Yes [provider]  simvastatin (ZOCOR) 20 MG tablet Take 20 mg by mouth daily.   Yes [provider]  tamsulosin (FLOMAX) 0.4 MG CAPS capsule Take 0.4 mg by mouth daily. 10/14/16  Yes [provider]  traZODone (DESYREL) 50 MG tablet Take 50 mg by mouth at bedtime. 10/14/16  Yes [provider]  valACYclovir (VALTREX) 500 MG tablet Take 500 mg by mouth daily. 09/22/16  Yes [provider]    Family History Family History  Problem Relation Age of Onset  . Asthma Mother     Social History Social History   Tobacco Use  . Smoking status: Never Smoker  . Smokeless tobacco: Former Neurosurgeon    Types: Snuff  . Tobacco comment: 10/22/15- Quit using snuff > 10 years ago  Substance Use Topics  . Alcohol use: No  . Drug use: No     Allergies   Bee venom   Review of Systems Review of Systems  Unable to perform ROS: Dementia  Constitutional: Positive for appetite change.  HENT: Positive for voice change.   Respiratory: Negative for shortness of breath.   Cardiovascular:  Negative for chest pain.  Gastrointestinal: Negative for abdominal pain, diarrhea and vomiting.  Genitourinary: Positive for decreased urine volume.  Skin: Negative for rash.  Allergic/Immunologic: Positive for immunocompromised state.  Neurological: Positive for weakness.  Hematological: Does not bruise/bleed easily.  Psychiatric/Behavioral: Positive for hallucinations.     Physical Exam Updated Vital Signs BP 136/78   Pulse 76   Temp 98.5 F (36.9 C) (Oral)   Resp 20   SpO2 100%   Physical Exam  Constitutional: He appears well-developed.  HENT:  Head: Normocephalic and atraumatic.  Hoarse   Eyes: Conjunctivae are normal.  Neck: Neck supple.  Cardiovascular: Normal rate, regular rhythm, normal heart sounds and intact distal pulses.  No murmur heard. Pulmonary/Chest: Effort normal and breath sounds normal. No respiratory distress.  Abdominal: Soft.  Bowel sounds are normal. He exhibits no distension. There is no tenderness.    Neurological: He is alert. He has normal strength. No sensory deficit. GCS eye subscore is 4. GCS verbal subscore is 4. GCS motor subscore is 6.  Skin: Skin is warm and dry.  Psychiatric: He has a normal mood and affect.  Nursing note and vitals reviewed.    ED Treatments / Results  Labs (all labs ordered are listed, but only abnormal results are displayed) Labs Reviewed  BASIC METABOLIC PANEL - Abnormal; Notable for the following components:      Result Value   CO2 20 (*)    BUN 24 (*)    Creatinine, Ser 1.88 (*)    GFR calc non Af Amer 33 (*)    GFR calc Af Amer 38 (*)    All other components within normal limits  CBC - Abnormal; Notable for the following components:   RBC 3.43 (*)    Hemoglobin 12.0 (*)    HCT 35.2 (*)    MCV 102.6 (*)    MCH 35.0 (*)    Platelets 131 (*)    All other components within normal limits  URINALYSIS, ROUTINE W REFLEX MICROSCOPIC - Abnormal; Notable for the following components:   Hgb urine dipstick  SMALL (*)    Ketones, ur 5 (*)    All other components within normal limits  CBG MONITORING, ED    EKG EKG Interpretation  Date/Time:  Saturday May 06 2018 15:44:35 EDT Ventricular Rate:  96 PR Interval:  176 QRS Duration: 86 QT Interval:  348 QTC Calculation: 439 R Axis:   66 Text Interpretation:  Normal sinus rhythm Cannot rule out Anterior infarct , age undetermined ST & T wave abnormality, consider inferolateral ischemia Abnormal ECG increased ST depression inferiorly/laterally Confirmed by Rolan Bucco [phone]) on 05/06/2018 4:18:25 PM Also confirmed by Rolan Bucco [phone]), editor Barbette Hair [phone])  on 05/06/2018 4:48:27 PM   Radiology Ct Head Wo Contrast  Result Date: 05/06/2018 CLINICAL DATA:  Generalized muscle weakness EXAM: CT HEAD WITHOUT CONTRAST TECHNIQUE: Contiguous axial images were obtained from the base of the skull through the vertex without intravenous contrast. COMPARISON:  None. FINDINGS: Brain: There is no mass, hemorrhage or extra-axial collection. There is generalized atrophy without lobar predilection. There is no acute or chronic infarction. There is hypoattenuation of the periventricular white matter, most commonly indicating chronic ischemic microangiopathy. Vascular: No abnormal hyperdensity of the major intracranial arteries or dural venous sinuses. No intracranial atherosclerosis. Skull: The visualized skull base, calvarium and extracranial soft tissues are normal. Sinuses/Orbits: No fluid levels or advanced mucosal thickening of the visualized paranasal sinuses. No mastoid or middle ear effusion. The orbits are normal. IMPRESSION: Atrophy and chronic small vessel disease without acute intracranial abnormality. Electronically Signed   By: Deatra Robinson M.D.   On: 05/06/2018 18:47   Dg Chest Port 1 View  Result Date: 05/06/2018 CLINICAL DATA:  Weakness, fatigue, loss of appetite EXAM: PORTABLE CHEST 1 VIEW COMPARISON:  None. FINDINGS: Heart and  mediastinal contours are within normal limits. No focal opacities or effusions. No acute bony abnormality. IMPRESSION: No active disease. Electronically Signed   By: Charlett Nose M.D.   On: 05/06/2018 17:58    Procedures Procedures (including critical care time)  Medications Ordered in ED Medications  sodium chloride 0.9 % bolus 500 mL (0 mLs Intravenous Stopped 05/06/18 1838)  0.9 %  sodium chloride infusion ( Intravenous New Bag/Given 05/06/18 2131)  Initial Impression / Assessment and Plan / ED Course  I have reviewed the triage vital signs and the nursing notes.  Pertinent labs & imaging results that were available during my care of the patient were reviewed by me and considered in my medical decision making (see chart for details).  Clinical Course as of May 07 2203  Sat May 06, 2018  2200 77 yo male brought in by daughter from home for generalized weakness, worse today and sounding hoarse. Patient's daughter was concerned he was dehydrated as he has not been drinking much fluid and barely eats. Discussed labs with patient and daughter, patient has had IV fluids, CT head without acute findings, chest xray unremarkable. UA with ketones, no UTI. CBC and CMP without significant changes from dc labs. Daughter would like to take patient home, feels comfortable assisting him with ambulation (able to stand with assistance in the Er). Plan is to follow up with gerontology, recommend return to Er for worsening or concerning symptoms.     [LM]    Clinical Course User Index [LM] Jeannie Fend, PA-C    Final Clinical Impressions(s) / ED Diagnoses   Final diagnoses:  Generalized weakness    ED Discharge Orders    None       Alden Hipp 05/06/18 2204    Rolan Bucco, MD 05/06/18 2207

## 2018-05-06 NOTE — ED Triage Notes (Signed)
Pt discharged from this hospital 04-27-18 for chest pain.  Per pts daughter, Onset 3 days pt has been weak, talking at a whisper.  Pt was agitated during last night and not eating or drinking.  Pt is Alert to person only.

## 2018-05-10 NOTE — Progress Notes (Signed)
Cardiology Office Note   Date:  05/11/2018   ID:  DANDRE Mitchell, DOB [birth year], MRN [id number]  PCP:  System, Pcp Not In  Cardiologist: Dr. Rennis Golden   Chief Complaint  Patient presents with  . Transitions Of Care     History of Present Illness: Brett Mitchell is a 77 y.o. male who presents for ongoing assessment and management of systolic dysfunction, NICM,, chronic chest pain, not a candidate for cardiac cath in the setting of demential and CKD. He is recommended for medical therapy. He has a history of Brett Mitchell. He is being followed by Dr. Weston Settle at [facility].   The patient was recently discharged on 04/27/2018 after admission for chest pain. Lexiscan NM study without perfusion deficits. He was given NTG for recurrent pain, continued on ASA and statin.   She was not placed on BB due to bradycardia.    Patient comes today with his daughter. He is very frail. She states he has lost 40 lbs since starting Namends. He is not eating or drinking very well. She has taken him to ER for syncopal episodes and he was found to be dehydrated. He comes today in a wheelchair not very verbal.   Past Medical History:  Diagnosis Date  . Arthritis   . Black lung disease (HCC)   . Mitchell   . Depression    situational  . Dyspnea    with exertion  . Heart murmur    as a child  . History of kidney stones    passed some- "still has some"  . Hyperlipemia   . Unstable angina [facility])     Past Surgical History:  Procedure Laterality Date  . KNEE ARTHROPLASTY Left   . NO PAST SURGERIES    . SCLERAL BUCKLE Right 10/26/2016   Procedure: SCLERAL BUCKLE, C3F8 INJECTION, RIGHT EYE;  Surgeon: Sherrie George, MD;  Location: [facility] OR;  Service: Ophthalmology;  Laterality: Right;     Current Outpatient Medications  Medication Sig Dispense Refill  . albuterol (PROVENTIL HFA;VENTOLIN HFA) 108 (90 Base) MCG/ACT inhaler Inhale 2 puffs into the lungs every 4 (four) hours as needed for wheezing or shortness  of breath.    Marland Kitchen aspirin 81 MG chewable tablet Chew 1 tablet (81 mg total) by mouth daily. 90 tablet 3  . clonazePAM (KLONOPIN) 0.5 MG tablet Take 0.5 mg by mouth at bedtime.     . fluticasone (FLONASE) 50 MCG/ACT nasal spray Place 2 sprays into both nostrils daily as needed for allergies.  0  . fluticasone furoate-vilanterol (BREO ELLIPTA) 100-25 MCG/INH AEPB Inhale 1 puff into the lungs 2 (two) times daily as needed (shortness of breath).    . folic acid (FOLVITE) 1 MG tablet Take 1 mg by mouth daily.    Bess Harvest Ethyl (VASCEPA) 1 g CAPS Take 1 g by mouth 2 (two) times daily.    Marland Kitchen MELATONIN PO Take 8 mg by mouth at bedtime.    . memantine (NAMENDA) 10 MG tablet Take 10 mg by mouth daily.  0  . mirtazapine (REMERON) 7.5 MG tablet Take 7.5 mg by mouth at bedtime.    . nitroGLYCERIN (NITROSTAT) 0.4 MG SL tablet Place 1 tablet (0.4 mg total) under the tongue every 5 (five) minutes as needed for chest pain. 25 tablet 3  . oxyCODONE (OXYCONTIN) 10 mg 12 hr tablet Take 10 mg by mouth 2 (two) times daily as needed (pain).    . Oxycodone HCl 10 MG TABS Take 10  mg by mouth 3 (three) times daily as needed (pain).    . QUEtiapine (SEROQUEL) 25 MG tablet Take 12.5 mg by mouth at bedtime.     . ranitidine (ZANTAC) 150 MG tablet Take 150 mg by mouth 2 (two) times daily.    . sertraline (ZOLOFT) 100 MG tablet Take 150 mg by mouth daily.    . simvastatin (ZOCOR) 20 MG tablet Take 20 mg by mouth daily.    . tamsulosin (FLOMAX) 0.4 MG CAPS capsule Take 0.4 mg by mouth daily.    . traZODone (DESYREL) 50 MG tablet Take 50 mg by mouth at bedtime.    . valACYclovir (VALTREX) 500 MG tablet Take 500 mg by mouth daily.     No current facility-administered medications for this visit.     Allergies:   Bee venom    Social History:  The patient  reports that he has never smoked. He has quit using smokeless tobacco. His smokeless tobacco use included snuff. He reports that he does not drink alcohol or use drugs.    Family History:  The patient's family history includes Asthma in his mother.    ROS: All other systems are reviewed and negative. Unless otherwise mentioned in H&P    PHYSICAL EXAM: VS:  BP (!) 81/54   Pulse 73   Ht 5\' 10"  (1.778 m)   Wt 126 lb 12.8 oz (57.5 kg)   BMI 18.19 kg/m  , BMI Body mass index is 18.19 kg/m. GEN: Well nourished, well developed, in no acute distress Frail sitting in a wheel chair.  HEENT: normal  Neck: no JVD, carotid bruits, or masses Cardiac: RRR; no murmurs, rubs, or gallops,no edema  Respiratory:  clear to auscultation bilaterally, normal work of breathing GI: soft, nontender, nondistended, + BS MS: no deformity or atrophy  Skin: warm and dry, no rash Neuro:  Strength and sensation are intact Psych: euthymic mood, flat affect. Non-verbal today.   Recent Labs: 05/06/2018: BUN 24; Creatinine, Ser 1.88; Hemoglobin 12.0; Platelets 131; Potassium 4.1; Sodium 137    Lipid Panel    Component Value Date/Time   CHOL 110 04/26/2018 2355   TRIG 113 04/26/2018 2355   HDL 44 04/26/2018 2355   CHOLHDL 2.5 04/26/2018 2355   VLDL 23 04/26/2018 2355   LDLCALC 43 04/26/2018 2355      Wt Readings from Last 3 Encounters:  05/11/18 126 lb 12.8 oz (57.5 kg)  04/27/18 134 lb (60.8 kg)  10/21/16 158 lb 9.6 oz (71.9 kg)      Other studies Reviewed Per H&P note EF of 45%-50% from echo completed at [facility] on 04/23/2018.     ASSESSMENT AND PLAN:  1. NICM: EF of 40%-45% per previous echo completed in [place]. He is not a candidate for cardiac cath on evaluation during recent hospitalization. He is not a candidate for BB due to bradycardia.  2. Hypotension: BP is very soft today. He is not eating or drinking very well. Last creatinine 1.88. He is advised to increase po fluids and also add nutritional supplements to his diet.   3. Lewy Body Mitchell: Followed by Dr. Weston Settle at [facility]. He is on multiple medications.   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     Current medicines are reviewed at length with the patient today.    Labs/ tests ordered today include: None  Bettey Mare. Liborio Nixon, ANP, AACC  05/11/2018 10:27 AM    [facility] [address], [place], [place] [address] Phone: [phone]; Fax: [phone]

## 2018-05-11 ENCOUNTER — Ambulatory Visit (INDEPENDENT_AMBULATORY_CARE_PROVIDER_SITE_OTHER): Payer: Medicare Other | Admitting: Adult Health

## 2018-05-11 ENCOUNTER — Encounter: Payer: Self-pay | Admitting: Adult Health

## 2018-05-11 VITALS — BP 81/54 | HR 73 | Ht 70.0 in | Wt 126.8 lb

## 2018-05-11 DIAGNOSIS — F0280 Dementia in other diseases classified elsewhere without behavioral disturbance: Secondary | ICD-10-CM

## 2018-05-11 DIAGNOSIS — I43 Cardiomyopathy in diseases classified elsewhere: Secondary | ICD-10-CM

## 2018-05-11 DIAGNOSIS — R627 Adult failure to thrive: Secondary | ICD-10-CM

## 2018-05-11 DIAGNOSIS — E861 Hypovolemia: Secondary | ICD-10-CM

## 2018-05-11 DIAGNOSIS — I9589 Other hypotension: Secondary | ICD-10-CM

## 2018-05-11 DIAGNOSIS — G3183 Dementia with Lewy bodies: Secondary | ICD-10-CM

## 2018-05-11 NOTE — Patient Instructions (Signed)
Medication Instructions:  NO CHANGES- Your physician recommends that you continue on your current medications as directed. Please refer to the Current Medication list given to you today.  If you need a refill on your cardiac medications before your next appointment, please call your pharmacy.  Follow-Up: Your physician wants you to follow-up in: 6 MONTHS WITH DR HILTY. You should receive a reminder letter in the mail two months in advance. If you do not receive a letter, please call our office DEC 2019 to schedule the FEB 2020 follow-up appointment.   Thank you for choosing [facility] at [facility]!!

## 2018-06-13 ENCOUNTER — Encounter (HOSPITAL_COMMUNITY): Payer: Self-pay | Admitting: Emergency Medicine

## 2018-06-13 ENCOUNTER — Emergency Department (HOSPITAL_COMMUNITY): Payer: Medicare Other

## 2018-06-13 ENCOUNTER — Emergency Department (HOSPITAL_COMMUNITY)
Admission: EM | Admit: 2018-06-13 | Discharge: 2018-06-14 | Disposition: A | Discharge status: Home / Self Care | Payer: Medicare Other | Attending: Emergency Medicine | Admitting: Emergency Medicine

## 2018-06-13 ENCOUNTER — Ambulatory Visit (HOSPITAL_COMMUNITY)
Admission: EM | Admit: 2018-06-13 | Discharge: 2018-06-13 | Disposition: A | Discharge status: Home / Self Care | Payer: Medicare Other | Source: Home / Self Care

## 2018-06-13 DIAGNOSIS — R350 Frequency of micturition: Secondary | ICD-10-CM | POA: Diagnosis not present

## 2018-06-13 DIAGNOSIS — N183 Chronic kidney disease, stage 3 (moderate): Secondary | ICD-10-CM | POA: Diagnosis not present

## 2018-06-13 DIAGNOSIS — Y999 Unspecified external cause status: Secondary | ICD-10-CM | POA: Insufficient documentation

## 2018-06-13 DIAGNOSIS — Z7982 Long term (current) use of aspirin: Secondary | ICD-10-CM | POA: Insufficient documentation

## 2018-06-13 DIAGNOSIS — F0390 Unspecified dementia without behavioral disturbance: Secondary | ICD-10-CM | POA: Diagnosis not present

## 2018-06-13 DIAGNOSIS — Y929 Unspecified place or not applicable: Secondary | ICD-10-CM | POA: Insufficient documentation

## 2018-06-13 DIAGNOSIS — Z79899 Other long term (current) drug therapy: Secondary | ICD-10-CM | POA: Diagnosis not present

## 2018-06-13 DIAGNOSIS — X58XXXA Exposure to other specified factors, initial encounter: Secondary | ICD-10-CM | POA: Insufficient documentation

## 2018-06-13 DIAGNOSIS — S0990XA Unspecified injury of head, initial encounter: Secondary | ICD-10-CM | POA: Diagnosis not present

## 2018-06-13 DIAGNOSIS — Y939 Activity, unspecified: Secondary | ICD-10-CM | POA: Insufficient documentation

## 2018-06-13 LAB — CBC WITH DIFFERENTIAL/PLATELET
ABS IMMATURE GRANULOCYTES: 0 10*3/uL (ref 0.0–0.1)
BASOS ABS: 0 10*3/uL (ref 0.0–0.1)
BASOS PCT: 1 %
Eosinophils Absolute: 0 10*3/uL (ref 0.0–0.7)
Eosinophils Relative: 1 %
HCT: 32.5 % — ABNORMAL LOW (ref 39.0–52.0)
HEMOGLOBIN: 11 g/dL — AB (ref 13.0–17.0)
IMMATURE GRANULOCYTES: 0 %
LYMPHS PCT: 33 %
Lymphs Abs: 1.8 10*3/uL (ref 0.7–4.0)
MCH: 35.4 pg — AB (ref 26.0–34.0)
MCHC: 33.8 g/dL (ref 30.0–36.0)
MCV: 104.5 fL — ABNORMAL HIGH (ref 78.0–100.0)
MONO ABS: 0.4 10*3/uL (ref 0.1–1.0)
MONOS PCT: 8 %
NEUTROS ABS: 3.2 10*3/uL (ref 1.7–7.7)
NEUTROS PCT: 57 %
PLATELETS: 144 10*3/uL — AB (ref 150–400)
RBC: 3.11 MIL/uL — ABNORMAL LOW (ref 4.22–5.81)
RDW: 13.2 % (ref 11.5–15.5)
WBC: 5.5 10*3/uL (ref 4.0–10.5)

## 2018-06-13 LAB — URINALYSIS, ROUTINE W REFLEX MICROSCOPIC
Bacteria, UA: NONE SEEN
Bilirubin Urine: NEGATIVE
Glucose, UA: NEGATIVE mg/dL
Ketones, ur: 5 mg/dL — AB
Leukocytes, UA: NEGATIVE
Nitrite: NEGATIVE
Protein, ur: NEGATIVE mg/dL
Specific Gravity, Urine: 1.02 (ref 1.005–1.030)
pH: 5 (ref 5.0–8.0)

## 2018-06-13 LAB — BASIC METABOLIC PANEL
ANION GAP: 11 (ref 5–15)
BUN: 18 mg/dL (ref 8–23)
CALCIUM: 9.1 mg/dL (ref 8.9–10.3)
CO2: 25 mmol/L (ref 22–32)
Chloride: 105 mmol/L (ref 98–111)
Creatinine, Ser: 1.65 mg/dL — ABNORMAL HIGH (ref 0.61–1.24)
GFR, EST AFRICAN AMERICAN: 45 mL/min — AB (ref >60)
GFR, EST NON AFRICAN AMERICAN: 39 mL/min — AB (ref >60)
GLUCOSE: 93 mg/dL (ref 70–99)
POTASSIUM: 3.8 mmol/L (ref 3.5–5.1)
Sodium: 141 mmol/L (ref 135–145)

## 2018-06-13 MED ORDER — FLEET ENEMA 7-19 GM/118ML RE ENEM
1.0000 | ENEMA | Freq: Once | RECTAL | Status: AC
  Administered 2018-06-13: 1 via RECTAL
  Filled 2018-06-13: qty 1

## 2018-06-13 NOTE — ED Provider Notes (Addendum)
[facility] EMERGENCY DEPARTMENT Provider Note   CSN: [id number] Arrival date & time: 06/13/18  1447     History   Chief Complaint Chief Complaint  Patient presents with  . Urinary Frequency  . Fall    HPI Brett Mitchell is a 77 y.o. male.  Patient is a 77 year old male who presents with urinary frequency.  His daughter who is his caretaker states that he has been urinating frequently for the last few days.  He is also had some constipation.  He does have a history of constipation.  She is been trying some MiraLAX occasionally without improvement in symptoms.  He has no nausea or vomiting.  No fevers.  No complaints of abdominal pain.  He also has a history of frequent falls.  He had a fall today where he fell forward striking his head on a wall.  He had no loss of consciousness.  His daughter states he is at his baseline mental status.  He has not been complained of any injuries from the fall.  He does have a history of dementia and history is limited by his dementia.     Past Medical History:  Diagnosis Date  . Arthritis   . Black lung disease (HCC)   . Dementia   . Depression    situational  . Dyspnea    with exertion  . Heart murmur    as a child  . History of kidney stones    passed some- "still has some"  . Hyperlipemia   . Unstable angina [facility])     Patient Active Problem List   Diagnosis Date Noted  . Unstable angina (HCC)   . NICM (nonischemic cardiomyopathy) (HCC) 04/27/2018  . Anemia 04/27/2018  . 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 CKD (chronic kidney disease) stage 3, GFR 30-59 ml/min (HCC) 04/27/2018  . 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 Chest pain 04/26/2018  . 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 KNEE ARTHROPLASTY Left   . NO PAST SURGERIES    . SCLERAL BUCKLE Right 10/26/2016   Procedure: SCLERAL BUCKLE, C3F8 INJECTION, RIGHT EYE;  Surgeon:  Sherrie George, MD;  Location: [facility] OR;  Service: Ophthalmology;  Laterality: Right;        Home Medications    Prior to Admission medications   Medication Sig Start Date End Date Taking? Authorizing Provider  albuterol (PROVENTIL HFA;VENTOLIN HFA) 108 (90 Base) MCG/ACT inhaler Inhale 2 puffs into the lungs every 4 (four) hours as needed for wheezing or shortness of breath.   Yes [provider]  aspirin 81 MG chewable tablet Chew 1 tablet (81 mg total) by mouth daily. 04/28/18  Yes Kroeger, Dot Lanes M., PA-C  clonazePAM (KLONOPIN) 0.5 MG tablet Take 0.5 mg by mouth at bedtime.  09/19/16  Yes [provider]  fluticasone (FLONASE) 50 MCG/ACT nasal spray Place 2 sprays into both nostrils daily as needed for allergies. 04/16/18  Yes [provider]  fluticasone furoate-vilanterol (BREO ELLIPTA) 100-25 MCG/INH AEPB Inhale 1 puff into the lungs 2 (two) times daily as needed (shortness of breath).   Yes [provider]  folic acid (FOLVITE) 1 MG tablet Take 1 mg by mouth daily.   Yes [provider]  Icosapent Ethyl (VASCEPA) 1 g CAPS Take 1 g by mouth 2 (two) times daily.   Yes [provider]  MELATONIN PO Take 8 mg by mouth at bedtime.  Yes [provider]  memantine (NAMENDA) 10 MG tablet Take 10 mg by mouth daily. 04/21/18  Yes [provider]  mirtazapine (REMERON) 7.5 MG tablet Take 7.5 mg by mouth at bedtime.   Yes [provider]  nitroGLYCERIN (NITROSTAT) 0.4 MG SL tablet Place 1 tablet (0.4 mg total) under the tongue every 5 (five) minutes as needed for chest pain. 04/27/18 04/27/19 Yes Kroeger, Ovidio Kin., PA-C  oxyCODONE (OXYCONTIN) 10 mg 12 hr tablet Take 10 mg by mouth 2 (two) times daily as needed (pain).   Yes [provider]  Oxycodone HCl 10 MG TABS Take 10 mg by mouth 3 (three) times daily as needed (pain).   Yes [provider]  QUEtiapine (SEROQUEL) 25 MG tablet Take 12.5 mg by mouth at  bedtime.    Yes [provider]  ranitidine (ZANTAC) 150 MG tablet Take 150 mg by mouth 2 (two) times daily. 10/01/16  Yes [provider]  sertraline (ZOLOFT) 100 MG tablet Take 150 mg by mouth daily. 09/10/16  Yes [provider]  simvastatin (ZOCOR) 20 MG tablet Take 20 mg by mouth daily.   Yes [provider]  tamsulosin (FLOMAX) 0.4 MG CAPS capsule Take 0.4 mg by mouth daily. 10/14/16  Yes [provider]  traZODone (DESYREL) 50 MG tablet Take 50 mg by mouth at bedtime. 10/14/16  Yes [provider]  valACYclovir (VALTREX) 500 MG tablet Take 500 mg by mouth daily. 09/22/16  Yes [provider]    Family History Family History  Problem Relation Age of Onset  . Asthma Mother     Social History Social History   Tobacco Use  . Smoking status: Never Smoker  . Smokeless tobacco: Former Neurosurgeon    Types: Snuff  . Tobacco comment: 10/22/15- Quit using snuff > 10 years ago  Substance Use Topics  . Alcohol use: No  . Drug use: No     Allergies   Bee venom   Review of Systems Review of Systems  Unable to perform ROS: Dementia     Physical Exam Updated Vital Signs BP (!) 139/96   Pulse 66   Temp 98.7 F (37.1 C) (Oral)   Resp 14   Ht 5\' 10"  (1.778 m)   Wt 55.8 kg   SpO2 100%   BMI 17.65 kg/m   Physical Exam  Constitutional: He appears well-developed and well-nourished.  HENT:  Head: Normocephalic and atraumatic.  Eyes: Pupils are equal, round, and reactive to light.  Neck: Normal range of motion. Neck supple.  Cardiovascular: Normal rate, regular rhythm and normal heart sounds.  Pulmonary/Chest: Effort normal and breath sounds normal. No respiratory distress. He has no wheezes. He has no rales. He exhibits no tenderness.  Abdominal: Soft. Bowel sounds are normal. There is no tenderness. There is no rebound and no guarding.  Genitourinary:  Genitourinary Comments: Patient has moderate stool in the rectum but  no impaction  Musculoskeletal: Normal range of motion. He exhibits no edema.  Lymphadenopathy:    He has no cervical adenopathy.  Neurological: He is alert.  Patient is alert and answers questions but is confused.  He moves all extremities symmetrically without obvious focal deficits  Skin: Skin is warm and dry. No rash noted.  Psychiatric: He has a normal mood and affect.     ED Treatments / Results  Labs (all labs ordered are listed, but only abnormal results are displayed) Labs Reviewed  URINALYSIS, ROUTINE W REFLEX MICROSCOPIC - Abnormal; Notable  for the following components:      Result Value   Hgb urine dipstick LARGE (*)    Ketones, ur 5 (*)    All other components within normal limits  BASIC METABOLIC PANEL - Abnormal; Notable for the following components:   Creatinine, Ser 1.65 (*)    GFR calc non Af Amer 39 (*)    GFR calc Af Amer 45 (*)    All other components within normal limits  CBC WITH DIFFERENTIAL/PLATELET - Abnormal; Notable for the following components:   RBC 3.11 (*)    Hemoglobin 11.0 (*)    HCT 32.5 (*)    MCV 104.5 (*)    MCH 35.4 (*)    Platelets 144 (*)    All other components within normal limits  URINE CULTURE    EKG None  Radiology Ct Head Wo Contrast  Result Date: 06/13/2018 CLINICAL DATA:  Patient fell and hit left side of head against drywall. EXAM: CT HEAD WITHOUT CONTRAST TECHNIQUE: Contiguous axial images were obtained from the base of the skull through the vertex without intravenous contrast. COMPARISON:  05/06/2018 FINDINGS: Brain: Redemonstration of moderate superficial and central atrophy with chronic appearing small vessel ischemia. No acute large vascular territory infarction. Midline fourth ventricle and basal cisterns. Brainstem and cerebellum are nonacute. Vascular: Atherosclerosis of the carotid siphons. No hyperdense vessel sign. Skull: The visualized skull base, calvarium and extracranial soft tissues are unremarkable.  Sinuses/Orbits: No acute finding. Bilateral cataract extractions with right-sided scleral buckle encircling the right globe. Other: None IMPRESSION: Chronic stable atrophy and small vessel ischemia without acute intracranial abnormality. Electronically Signed   By: Tollie Eth M.D.   On: 06/13/2018 18:35    Procedures Procedures (including critical care time)  Medications Ordered in ED Medications  sodium phosphate (FLEET) 7-19 GM/118ML enema 1 enema (has no administration in time range)     Initial Impression / Assessment and Plan / ED Course  I have reviewed the triage vital signs and the nursing notes.  Pertinent labs & imaging results that were available during my care of the patient were reviewed by me and considered in my medical decision making (see chart for details).     Patient is a 77 year old male who presents with urinary frequency.  He did have a fall which is not unusual for him.  He has no change in mental status.  No focal neurologic deficits.  No recent illnesses.  His CT scan of his head is negative.  He does not have any other apparent injuries.  He has had some recent urinary frequency.  His urine has some hematuria but no other suggestions of infection.  It was sent for culture.  I feel he likely has some degree of BPH contributing to his frequency as well as chronic constipation.  His bladder scan did not show retention.  Rectal exam showed some stool but no impaction.  We will give him an enema.  His daughter will continue MiraLAX at home.  He was given a referral to follow-up with urology.  He was also encouraged to follow-up with his PCP.  Dr. Wilkie Aye to d/c after enema.  Final Clinical Impressions(s) / ED Diagnoses   Final diagnoses:  Urinary frequency  Injury of head, initial encounter    ED Discharge Orders    None       Rolan Bucco, MD 06/13/18 2340    Rolan Bucco, MD 06/13/18 2356

## 2018-06-13 NOTE — ED Notes (Signed)
Pt placed on bedpan at this time.

## 2018-06-13 NOTE — ED Triage Notes (Signed)
Pt presents today with complaints of falling and hitting his head. Per Amy PA, patient needs to go to the ER for further work up with a possible CT scan of his head. Pt and family verbalized understanding.

## 2018-06-13 NOTE — ED Triage Notes (Addendum)
Patient sent to ED by [facility] for further evaluation following unwitnessed mechanical fall about 11:30am today. Patient's daughter and son report hearing patient fall and found him lying on the floor - hit head on wall (no obvious injury noted, no LOC), but dent in drywall per daughter. Patient has had recent falls (in own wheelchair in triage) - hx dementia - at baseline per family. Daughter also reports urinary frequency and oliguria x 6 days - pt denies pain, fevers/chills.

## 2018-06-13 NOTE — ED Notes (Signed)
Patient transported to CT 

## 2018-06-13 NOTE — ED Notes (Signed)
Called pt x2 for triage, no response. 

## 2018-06-13 NOTE — ED Provider Notes (Signed)
Patient placed in Quick Look pathway, seen and evaluated   Chief Complaint: urinary frequency, falls  HPI:   Pt is a 77 y/o male with a h/o lewy body dementia who presents to the ED for evaluation of urinary frequency that has been ongoing for the last 5-6 days. Pt denies dysuria, fevers, chills, abd pain. Denies NVD. Pt has been constipated and had last BM today. Daughter at bedside has been giving miralax.   Daughter states pt had fall earlier today. States pt hit head on the wall. She is unsure of LOC, but does not think so. Has h/o mechanical falls as he does not use his cane at home.   Pt denies neck pain.   ROS: urinary frequency, fall (one)  Physical Exam:   Gen: No distress  Neuro: Awake and Alert  Skin: Warm    Focused Exam: cranial nerves II-XII intact. 5/5 strength to BUE and BLE. Normal sensation. No TTP to the face or head. PERRL. EOM intact. No cervical spine TTP.    Initiation of care has begun. The patient has been counseled on the process, plan, and necessity for staying for the completion/evaluation, and the remainder of the medical screening examination  Pt advised to inform nursing staff immediately if they experience any new or worsening of symptoms while waiting in the waiting room.    Rayne Du 06/13/18 1610    Gerhard Munch, MD 06/15/18 [phone]

## 2018-06-14 NOTE — ED Notes (Signed)
PT states understanding of care given, follow up care. PT wheelchaired from ED to car. 

## 2018-06-15 LAB — URINE CULTURE: Culture: NO GROWTH

## 2018-06-20 ENCOUNTER — Encounter (HOSPITAL_COMMUNITY): Payer: Self-pay | Admitting: Emergency Medicine

## 2018-06-20 ENCOUNTER — Emergency Department (HOSPITAL_COMMUNITY)
Admission: EM | Admit: 2018-06-20 | Discharge: 2018-06-20 | Disposition: A | Discharge status: Home / Self Care | Payer: Medicare Other | Attending: Emergency Medicine | Admitting: Emergency Medicine

## 2018-06-20 DIAGNOSIS — N183 Chronic kidney disease, stage 3 (moderate): Secondary | ICD-10-CM | POA: Insufficient documentation

## 2018-06-20 DIAGNOSIS — R55 Syncope and collapse: Secondary | ICD-10-CM | POA: Diagnosis not present

## 2018-06-20 DIAGNOSIS — Z87891 Personal history of nicotine dependence: Secondary | ICD-10-CM | POA: Insufficient documentation

## 2018-06-20 DIAGNOSIS — R350 Frequency of micturition: Secondary | ICD-10-CM | POA: Diagnosis not present

## 2018-06-20 DIAGNOSIS — Z7982 Long term (current) use of aspirin: Secondary | ICD-10-CM | POA: Diagnosis not present

## 2018-06-20 DIAGNOSIS — Z79899 Other long term (current) drug therapy: Secondary | ICD-10-CM | POA: Insufficient documentation

## 2018-06-20 DIAGNOSIS — F0390 Unspecified dementia without behavioral disturbance: Secondary | ICD-10-CM | POA: Insufficient documentation

## 2018-06-20 DIAGNOSIS — Z96652 Presence of left artificial knee joint: Secondary | ICD-10-CM | POA: Insufficient documentation

## 2018-06-20 LAB — BASIC METABOLIC PANEL
Anion gap: 12 (ref 5–15)
BUN: 15 mg/dL (ref 8–23)
CALCIUM: 8.4 mg/dL — AB (ref 8.9–10.3)
CO2: 20 mmol/L — ABNORMAL LOW (ref 22–32)
CREATININE: 1.54 mg/dL — AB (ref 0.61–1.24)
Chloride: 106 mmol/L (ref 98–111)
GFR calc Af Amer: 49 mL/min — ABNORMAL LOW (ref >60)
GFR calc non Af Amer: 42 mL/min — ABNORMAL LOW (ref >60)
GLUCOSE: 79 mg/dL (ref 70–99)
Potassium: 3.7 mmol/L (ref 3.5–5.1)
Sodium: 138 mmol/L (ref 135–145)

## 2018-06-20 LAB — CBC WITH DIFFERENTIAL/PLATELET
ABS IMMATURE GRANULOCYTES: 0 10*3/uL (ref 0.0–0.1)
BASOS ABS: 0 10*3/uL (ref 0.0–0.1)
Basophils Relative: 1 %
Eosinophils Absolute: 0 10*3/uL (ref 0.0–0.7)
Eosinophils Relative: 0 %
HCT: 37.4 % — ABNORMAL LOW (ref 39.0–52.0)
Hemoglobin: 12.6 g/dL — ABNORMAL LOW (ref 13.0–17.0)
Immature Granulocytes: 0 %
Lymphocytes Relative: 22 %
Lymphs Abs: 1.5 10*3/uL (ref 0.7–4.0)
MCH: 35.2 pg — AB (ref 26.0–34.0)
MCHC: 33.7 g/dL (ref 30.0–36.0)
MCV: 104.5 fL — AB (ref 78.0–100.0)
MONO ABS: 0.3 10*3/uL (ref 0.1–1.0)
Monocytes Relative: 5 %
NEUTROS ABS: 5 10*3/uL (ref 1.7–7.7)
Neutrophils Relative %: 72 %
Platelets: 160 10*3/uL (ref 150–400)
RBC: 3.58 MIL/uL — ABNORMAL LOW (ref 4.22–5.81)
RDW: 13.1 % (ref 11.5–15.5)
WBC: 7 10*3/uL (ref 4.0–10.5)

## 2018-06-20 LAB — URINALYSIS, ROUTINE W REFLEX MICROSCOPIC
BILIRUBIN URINE: NEGATIVE
GLUCOSE, UA: NEGATIVE mg/dL
HGB URINE DIPSTICK: NEGATIVE
KETONES UR: 5 mg/dL — AB
Leukocytes, UA: NEGATIVE
Nitrite: NEGATIVE
Protein, ur: NEGATIVE mg/dL
Specific Gravity, Urine: 1.015 (ref 1.005–1.030)
pH: 5 (ref 5.0–8.0)

## 2018-06-20 LAB — TROPONIN I: Troponin I: <0.03 ng/mL (ref <0.03)

## 2018-06-20 MED ORDER — QUETIAPINE FUMARATE 25 MG PO TABS: 12.5000 mg | ORAL_TABLET | Freq: Every day | ORAL | 0 refills | Status: AC

## 2018-06-20 MED ORDER — SODIUM CHLORIDE 0.9 % IV BOLUS
1000.0000 mL | Freq: Once | INTRAVENOUS | Status: AC
  Administered 2018-06-20: 1000 mL via INTRAVENOUS

## 2018-06-20 MED ORDER — SODIUM CHLORIDE 0.9 % IV SOLN
INTRAVENOUS | Status: DC
Start: 2018-06-20 — End: 2018-06-21

## 2018-06-20 NOTE — ED Notes (Signed)
Pt agitated and wanting to get out of bed. Redirected pt. Pt unable to provide UA at this time

## 2018-06-20 NOTE — ED Provider Notes (Signed)
[facility] EMERGENCY DEPARTMENT Provider Note   CSN: [id number] Arrival date & time: 06/20/18  1525     History   Chief Complaint Chief Complaint  Patient presents with  . Loss of Consciousness    HPI KHAOS LADUCER is a 77 y.o. male.  77 year old male presents after having syncopal episode while at his urologist office.  Patient was receiving a rectal exam when he became lightheaded and had a brief syncopal episode lasting few seconds.  No seizure activity.  Patient has been at his baseline state of health.  According to the daughter, he has had multiple episodes of syncope for the past month or so.  No etiology found at this time.  Was sent here for further management.     Past Medical History:  Diagnosis Date  . Arthritis   . Black lung disease (HCC)   . Dementia   . Depression    situational  . Dyspnea    with exertion  . Heart murmur    as a child  . History of kidney stones    passed some- "still has some"  . Hyperlipemia   . Unstable angina [facility])     Patient Active Problem List   Diagnosis Date Noted  . Unstable angina (HCC)   . NICM (nonischemic cardiomyopathy) (HCC) 04/27/2018  . Anemia 04/27/2018  . 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 CKD (chronic kidney disease) stage 3, GFR 30-59 ml/min (HCC) 04/27/2018  . 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 Chest pain 04/26/2018  . 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 KNEE ARTHROPLASTY Left   . NO PAST SURGERIES    . SCLERAL BUCKLE Right 10/26/2016   Procedure: SCLERAL BUCKLE, C3F8 INJECTION, RIGHT EYE;  Surgeon: Sherrie George, MD;  Location: [facility] OR;  Service: Ophthalmology;  Laterality: Right;        Home Medications    Prior to Admission medications   Medication Sig Start Date End Date Taking? Authorizing Provider  albuterol (PROVENTIL HFA;VENTOLIN HFA) 108 (90 Base) MCG/ACT inhaler Inhale 2 puffs  into the lungs every 4 (four) hours as needed for wheezing or shortness of breath.    [provider]  aspirin 81 MG chewable tablet Chew 1 tablet (81 mg total) by mouth daily. 04/28/18   Kroeger, Ovidio Kin., PA-C  clonazePAM (KLONOPIN) 0.5 MG tablet Take 0.5 mg by mouth at bedtime.  09/19/16   [provider]  fluticasone (FLONASE) 50 MCG/ACT nasal spray Place 2 sprays into both nostrils daily as needed for allergies. 04/16/18   [provider]  fluticasone furoate-vilanterol (BREO ELLIPTA) 100-25 MCG/INH AEPB Inhale 1 puff into the lungs 2 (two) times daily as needed (shortness of breath).    [provider]  folic acid (FOLVITE) 1 MG tablet Take 1 mg by mouth daily.    [provider]  Icosapent Ethyl (VASCEPA) 1 g CAPS Take 1 g by mouth 2 (two) times daily.    [provider]  MELATONIN PO Take 8 mg by mouth at bedtime.    [provider]  memantine (NAMENDA) 10 MG tablet Take 10 mg by mouth daily. 04/21/18   [provider]  mirtazapine (REMERON) 7.5 MG tablet Take 7.5 mg by mouth at bedtime.    [provider]  nitroGLYCERIN (NITROSTAT) 0.4 MG SL tablet Place 1 tablet (0.4 mg total) under the tongue every 5 (five) minutes as needed for  chest pain. 04/27/18 04/27/19  Kroeger, Ovidio Kin., PA-C  oxyCODONE (OXYCONTIN) 10 mg 12 hr tablet Take 10 mg by mouth 2 (two) times daily as needed (pain).    [provider]  Oxycodone HCl 10 MG TABS Take 10 mg by mouth 3 (three) times daily as needed (pain).    [provider]  QUEtiapine (SEROQUEL) 25 MG tablet Take 12.5 mg by mouth at bedtime.     [provider]  ranitidine (ZANTAC) 150 MG tablet Take 150 mg by mouth 2 (two) times daily. 10/01/16   [provider]  sertraline (ZOLOFT) 100 MG tablet Take 150 mg by mouth daily. 09/10/16   [provider]  simvastatin (ZOCOR) 20 MG tablet Take 20 mg by mouth daily.    [provider]    tamsulosin (FLOMAX) 0.4 MG CAPS capsule Take 0.4 mg by mouth daily. 10/14/16   [provider]  traZODone (DESYREL) 50 MG tablet Take 50 mg by mouth at bedtime. 10/14/16   [provider]  valACYclovir (VALTREX) 500 MG tablet Take 500 mg by mouth daily. 09/22/16   [provider]    Family History Family History  Problem Relation Age of Onset  . Asthma Mother     Social History Social History   Tobacco Use  . Smoking status: Never Smoker  . Smokeless tobacco: Former Neurosurgeon    Types: Snuff  . Tobacco comment: 10/22/15- Quit using snuff > 10 years ago  Substance Use Topics  . Alcohol use: No  . Drug use: No     Allergies   Bee venom   Review of Systems Review of Systems  All other systems reviewed and are negative.    Physical Exam Updated Vital Signs BP (!) 134/91   Pulse 71   Temp (!) 97.4 F (36.3 C) (Oral)   Resp 11   Ht 1.778 m (5\' 10" )   Wt 55.8 kg   SpO2 95%   BMI 17.65 kg/m   Physical Exam  Constitutional: He is oriented to person, place, and time. He appears well-developed and well-nourished.  Non-toxic appearance. No distress.  HENT:  Head: Normocephalic and atraumatic.  Eyes: Pupils are equal, round, and reactive to light. Conjunctivae, EOM and lids are normal.  Neck: Normal range of motion. Neck supple. No tracheal deviation present. No thyroid mass present.  Cardiovascular: Normal rate, regular rhythm and normal heart sounds. Exam reveals no gallop.  No murmur heard. Pulmonary/Chest: Effort normal and breath sounds normal. No stridor. No respiratory distress. He has no decreased breath sounds. He has no wheezes. He has no rhonchi. He has no rales.  Abdominal: Soft. Normal appearance and bowel sounds are normal. He exhibits no distension. There is no tenderness. There is no rebound and no CVA tenderness.  Musculoskeletal: Normal range of motion. He exhibits no edema or tenderness.  Neurological: He is alert and oriented to  person, place, and time. He has normal strength. No cranial nerve deficit or sensory deficit. GCS eye subscore is 4. GCS verbal subscore is 5. GCS motor subscore is 6.  Skin: Skin is warm and dry. No abrasion and no rash noted.  Psychiatric: He has a normal mood and affect. His speech is normal and behavior is normal.  Nursing note and vitals reviewed.    ED Treatments / Results  Labs (all labs ordered are listed, but only abnormal results are displayed) Labs Reviewed  URINE CULTURE  TROPONIN I  CBC WITH DIFFERENTIAL/PLATELET  BASIC METABOLIC PANEL  URINALYSIS, ROUTINE W REFLEX MICROSCOPIC    EKG EKG Interpretation  Date/Time:  Tuesday June 20 2018 15:32:11 EDT Ventricular Rate:  67 PR Interval:    QRS Duration: 102 QT Interval:  452 QTC Calculation: 478 R Axis:   11 Text Interpretation:  Anteroseptal infarct, age indeterminate Confirmed by Lorre Nick ([id number]) on 06/20/2018 3:37:52 PM   Radiology No results found.  Procedures Procedures (including critical care time)  Medications Ordered in ED Medications  sodium chloride 0.9 % bolus 1,000 mL (has no administration in time range)  0.9 %  sodium chloride infusion (has no administration in time range)     Initial Impression / Assessment and Plan / ED Course  I have reviewed the triage vital signs and the nursing notes.  Pertinent labs & imaging results that were available during my care of the patient were reviewed by me and considered in my medical decision making (see chart for details).     IV fluids and does feel better.  Suspect that he had a vagal episode during his rectal exam.  He has been at his baseline.  Was to see urology today for urinary frequency and his urinalysis today is normal. Creatinine is at his baseline.  He is stable for discharge Final Clinical Impressions(s) / ED Diagnoses   Final diagnoses:  None    ED Discharge Orders    None       Lorre Nick, MD 06/20/18 2154

## 2018-06-20 NOTE — ED Notes (Signed)
Lab called to check on troponin and BMP, lab stated they received them

## 2018-06-20 NOTE — ED Notes (Signed)
Lab reports dropping troponin and BMP bloodwork, repeat drawn and sent down to lab

## 2018-06-20 NOTE — ED Triage Notes (Signed)
Pt arrives via EMS from urology due to a witnessed syncopal episode. Per family, pt has had about 15-20 episodes. Hx of dementia. Pt was at urology for increased urinary frequency. BP was 88/63 when episode occurred but has remained 110-120 systolic since EMS got him.

## 2018-06-21 ENCOUNTER — Telehealth: Payer: Self-pay | Admitting: Adult Health

## 2018-06-21 NOTE — Telephone Encounter (Signed)
New Message         Patient daughter is calling today because patient is passing out and he is unresponsive when doing so.  Patient is fine at this moment, Pls call and advise.

## 2018-06-21 NOTE — Telephone Encounter (Signed)
Called daughter and advised of the episodes her dad was having. She states that he feels fine until he stands and then he will pass out and become unresponsive. They were sent to the ER yesterday and once he got home he had two more episodes, they advised him to follow up with Cardiology. I advised daughter of appointment with Dr.Hilty on 06/26/18 at 3:45. Patient daughter verbalized understanding, in the mean time I did advised the daughter to tell him when he is laying down to sit up and sit for a minute and then stand and stand for a minute before attempting to walk. Patient daughter understood.

## 2018-06-22 LAB — URINE CULTURE: Culture: NO GROWTH

## 2018-06-26 ENCOUNTER — Ambulatory Visit (INDEPENDENT_AMBULATORY_CARE_PROVIDER_SITE_OTHER): Payer: PRIVATE HEALTH INSURANCE | Admitting: Internal Medicine

## 2018-06-26 ENCOUNTER — Encounter: Payer: Self-pay | Admitting: Internal Medicine

## 2018-06-26 VITALS — BP 98/64 | HR 86 | Ht 70.0 in | Wt 122.0 lb

## 2018-06-26 DIAGNOSIS — I43 Cardiomyopathy in diseases classified elsewhere: Secondary | ICD-10-CM

## 2018-06-26 DIAGNOSIS — I951 Orthostatic hypotension: Secondary | ICD-10-CM | POA: Diagnosis not present

## 2018-06-26 DIAGNOSIS — R627 Adult failure to thrive: Secondary | ICD-10-CM

## 2018-06-26 DIAGNOSIS — G3183 Dementia with Lewy bodies: Secondary | ICD-10-CM | POA: Diagnosis not present

## 2018-06-26 DIAGNOSIS — F0280 Dementia in other diseases classified elsewhere without behavioral disturbance: Secondary | ICD-10-CM

## 2018-06-26 MED ORDER — MIDODRINE HCL 10 MG PO TABS: 10.0000 mg | ORAL_TABLET | Freq: Three times a day (TID) | ORAL | 3 refills | Status: AC

## 2018-06-26 NOTE — Patient Instructions (Addendum)
Your physician has recommended you make the following change in your medication:  -- START midodrine 10mg  three times daily  Your physician recommends that you schedule a follow-up appointment in: THREE MONTHS with Dr. Rennis Golden.

## 2018-06-26 NOTE — Progress Notes (Signed)
OFFICE NOTE  Chief Complaint:  Hospital follow-up  Primary Care Physician: Gordan Payment., MD  HPI:  Brett Mitchell is a 77 y.o. male with a past medial history significant for Lewy-Body dementia, recent admission for dehydration to hospital in [place], [place], incidental chest pain - echo showed EF 45% and myoview negative for ischemia. Transferred for possible cath to [facility].  He was therefore found to have significant CKD and unexplained anemia and felt to be a poor candidate for heart cath therefore we did not pursue it.  Subsequently he has had progressive Lewy body dementia to the point where he is essentially nonverbal.  He is also had significant autonomic dysfunction with recent falls that have been almost on a daily basis since the beginning of August.  Recently he saw his urologist and passed out in the office.  This is the biggest concern at this point.  PMHx:  Past Medical History:  Diagnosis Date  . Arthritis   . Black lung disease (HCC)   . Dementia   . Depression    situational  . Dyspnea    with exertion  . Heart murmur    as a child  . History of kidney stones    passed some- "still has some"  . Hyperlipemia   . Unstable angina [facility])     Past Surgical History:  Procedure Laterality Date  . KNEE ARTHROPLASTY Left   . NO PAST SURGERIES    . SCLERAL BUCKLE Right 10/26/2016   Procedure: SCLERAL BUCKLE, C3F8 INJECTION, RIGHT EYE;  Surgeon: Sherrie George, MD;  Location: [facility];  Service: Ophthalmology;  Laterality: Right;    FAMHx:  Family History  Problem Relation Age of Onset  . Asthma Mother     SOCHx:   reports that he has never smoked. He has quit using smokeless tobacco.  His smokeless tobacco use included snuff. He reports that he does not drink alcohol or use drugs.  ALLERGIES:  Allergies  Allergen Reactions  . Bee Venom Swelling    Swelling at sting site    ROS: Pertinent items noted in HPI and remainder of comprehensive ROS  otherwise negative.  HOME MEDS: Current Outpatient Medications on File Prior to Visit  Medication Sig Dispense Refill  . albuterol (PROVENTIL HFA;VENTOLIN HFA) 108 (90 Base) MCG/ACT inhaler Inhale 2 puffs into the lungs every 4 (four) hours as needed for wheezing or shortness of breath.    Marland Kitchen aspirin 81 MG chewable tablet Chew 1 tablet (81 mg total) by mouth daily. 90 tablet 3  . clonazePAM (KLONOPIN) 0.5 MG tablet Take 0.5 mg by mouth at bedtime.     . fluticasone (FLONASE) 50 MCG/ACT nasal spray Place 2 sprays into both nostrils daily as needed for allergies.  0  . fluticasone furoate-vilanterol (BREO ELLIPTA) 100-25 MCG/INH AEPB Inhale 1 puff into the lungs 2 (two) times daily as needed (shortness of breath).    . folic acid (FOLVITE) 1 MG tablet Take 1 mg by mouth daily.    Bess Harvest Ethyl (VASCEPA) 1 g CAPS Take 1 g by mouth 2 (two) times daily.    Marland Kitchen MELATONIN PO Take 8 mg by mouth at bedtime.    . memantine (NAMENDA) 10 MG tablet Take 10 mg by mouth daily.  0  . mirtazapine (REMERON) 7.5 MG tablet Take 7.5 mg by mouth at bedtime.    . nitroGLYCERIN (NITROSTAT) 0.4 MG SL tablet Place 1 tablet (0.4 mg total) under the tongue every  5 (five) minutes as needed for chest pain. 25 tablet 3  . oxyCODONE (OXYCONTIN) 10 mg 12 hr tablet Take 10 mg by mouth 2 (two) times daily as needed (pain).    . Oxycodone HCl 10 MG TABS Take 10 mg by mouth 3 (three) times daily as needed (pain).    . QUEtiapine (SEROQUEL) 25 MG tablet Take 0.5 tablets (12.5 mg total) by mouth at bedtime. 30 tablet 0  . ranitidine (ZANTAC) 150 MG tablet Take 150 mg by mouth 2 (two) times daily.    . sertraline (ZOLOFT) 100 MG tablet Take 150 mg by mouth daily.    . simvastatin (ZOCOR) 20 MG tablet Take 20 mg by mouth daily.    . tamsulosin (FLOMAX) 0.4 MG CAPS capsule Take 0.4 mg by mouth daily.    . traZODone (DESYREL) 50 MG tablet Take 50 mg by mouth at bedtime.    . valACYclovir (VALTREX) 500 MG tablet Take 500 mg by mouth  daily.     No current facility-administered medications on file prior to visit.     LABS/IMAGING: No results found for this or any previous visit (from the past 48 hour(s)). No results found.  LIPID PANEL:    Component Value Date/Time   CHOL 110 04/26/2018 2355   TRIG 113 04/26/2018 2355   HDL 44 04/26/2018 2355   CHOLHDL 2.5 04/26/2018 2355   VLDL 23 04/26/2018 2355   LDLCALC 43 04/26/2018 2355     WEIGHTS: Wt Readings from Last 3 Encounters:  06/26/18 122 lb (55.3 kg)  06/20/18 123 lb (55.8 kg)  06/13/18 123 lb (55.8 kg)    VITALS: BP 98/64 (BP Location: Left Arm, Patient Position: Sitting, Cuff Size: Normal)   Pulse 86   Ht 5\' 10"  (1.778 m)   Wt 122 lb (55.3 kg)   BMI 17.51 kg/m   EXAM: General appearance: alert, cachectic and no distress Neck: no carotid bruit, no JVD and thyroid not enlarged, symmetric, no tenderness/mass/nodules Lungs: clear to auscultation bilaterally Heart: regular rate and rhythm Abdomen: soft, non-tender; bowel sounds normal; no masses,  no organomegaly Extremities: extremities normal, atraumatic, no cyanosis or edema Pulses: 2+ and symmetric Skin: Skin color, texture, turgor normal. No rashes or lesions Neurologic: Grossly normal Psych: Pleasant, nonverbal  EKG: Deferred  ASSESSMENT: 1. Autonomic orthostatic hypotension 2. Recurrent syncope 3. 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 CKD 6. Anemia of CKD 7. BPH  PLAN: 1.   Brett Mitchell is having recurrent syncope likely secondary to autonomic orthostatic hypotension.  Blood pressure is noted to be low today.  He has BPH on tamsulosin.  I would encourage his urologist to consider an alternative option.  I would recommend starting midodrine 10 mg 3 times daily since he is having daily syncope at this point.  If he fails this, then I would not hesitate to consider switching to Northera. This could be started in conjunction with his neurologist at [facility] ([place] Prognosis  seems increasingly poor.  Follow-up with me in 3 months.  Chrystie Nose, MD, [facility], FACP  [place]  [facility]  Medical Director of the [facility] Diplomate of the American Board of Clinical Lipidology Attending Cardiologist  Direct Dial: [phone]  Fax: [phone]  Website:  [email or web address] 06/26/2018, 3:46 PM

## 2018-07-27 ENCOUNTER — Encounter (INDEPENDENT_AMBULATORY_CARE_PROVIDER_SITE_OTHER): Payer: PRIVATE HEALTH INSURANCE | Admitting: Ophthalmology

## 2018-08-11 DEATH — deceased

## 2018-09-27 ENCOUNTER — Ambulatory Visit: Payer: PRIVATE HEALTH INSURANCE | Admitting: Internal Medicine

## 2020-05-07 IMAGING — CT CT HEAD W/O CM
3 series · 16 of 47 positions shown, 19 images · non-contrast
Comparison: 05/06/2018

CLINICAL DATA: Patient fell and hit left side of head against
drywall.

EXAM:
CT HEAD WITHOUT CONTRAST
TECHNIQUE: Contiguous axial images were obtained from the base of the skull
through the vertex without intravenous contrast.

[Series 3: head 5.0 h30s · axial · 0.45mm/px · z∈[-122,+13]mm · 10 of 33 slices shown, 13 images]
[im 3/33  brain]
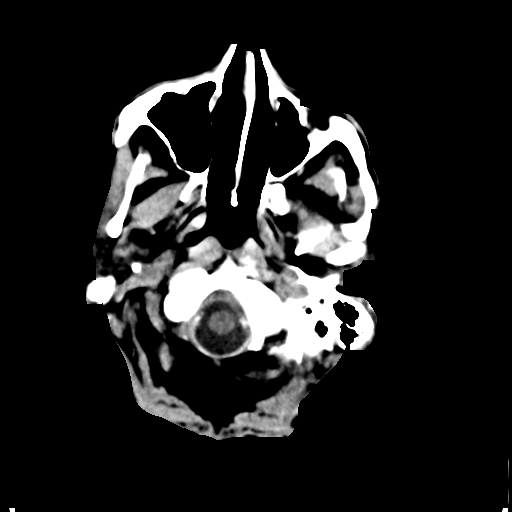
[im 3/33  bone]
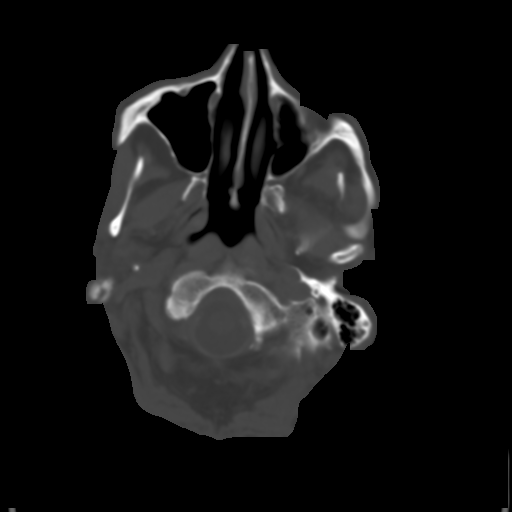
[im 6/33  brain]
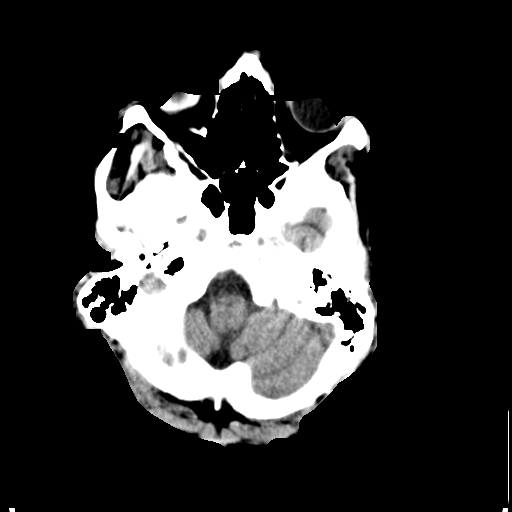
[im 9/33  brain]
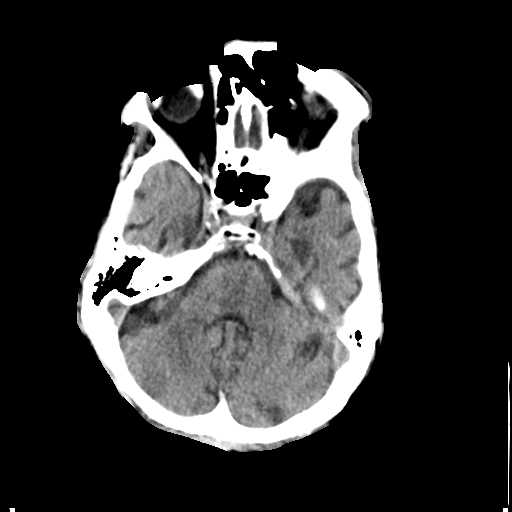
[im 12/33  brain]
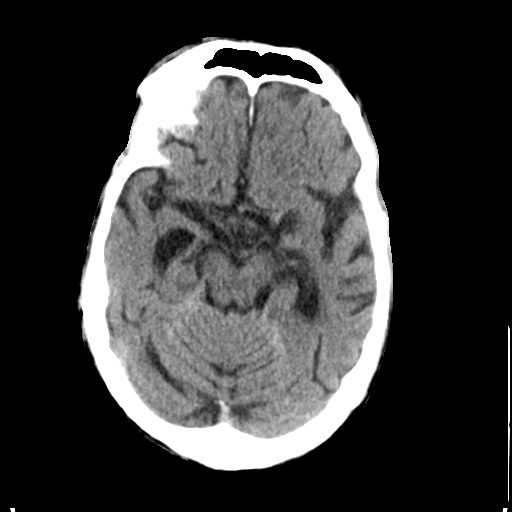
[im 15/33  brain]
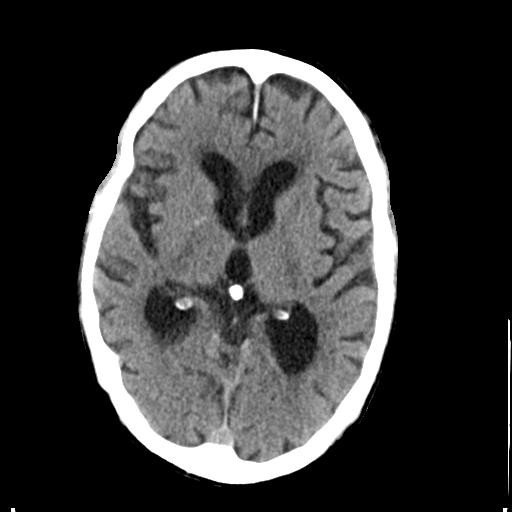
[im 15/33  bone]
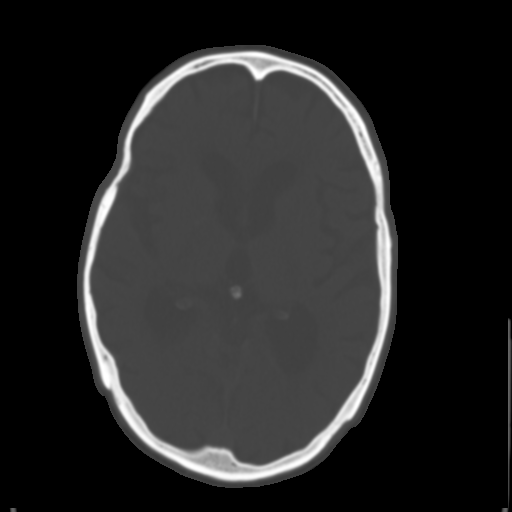
[im 18/33  brain]
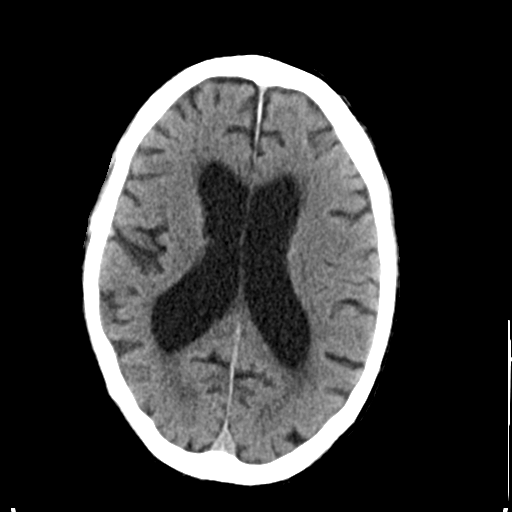
[im 21/33  brain]
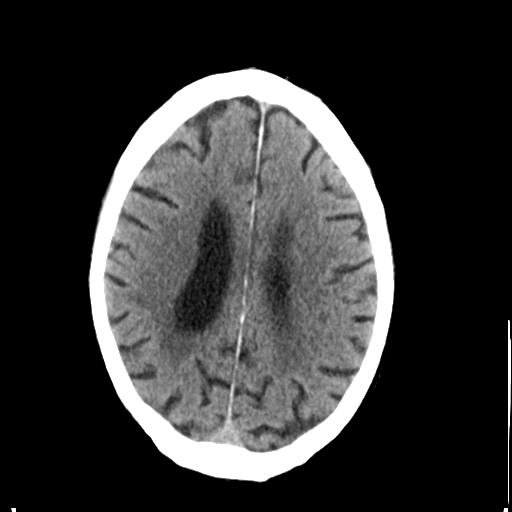
[im 25/33  brain]
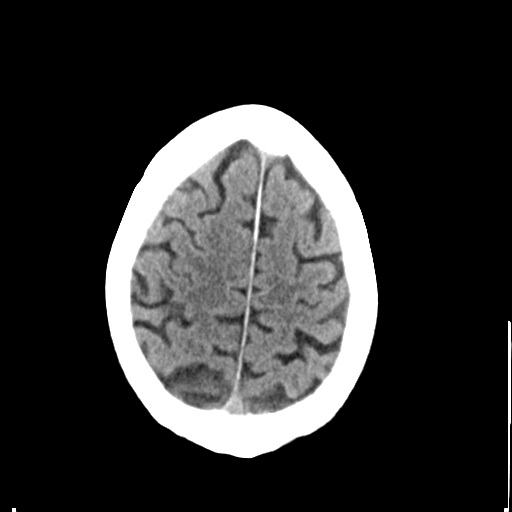
[im 27/33  brain]
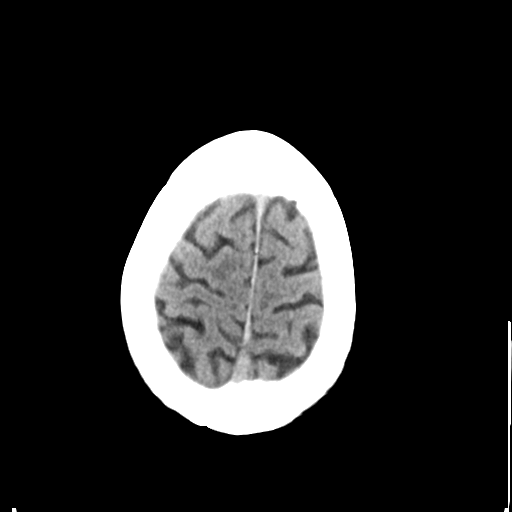
[im 27/33  bone]
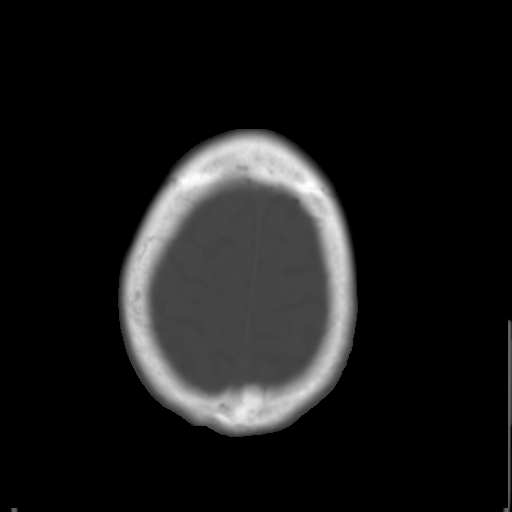
[im 30/33  brain]
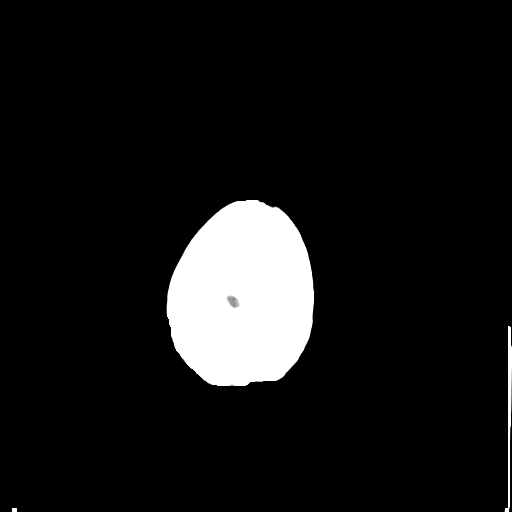

[Series 5: head 3.0 mpr cor · coronal · 0.32mm/px · 3 of 71 slices shown]
[im 24/71  brain]
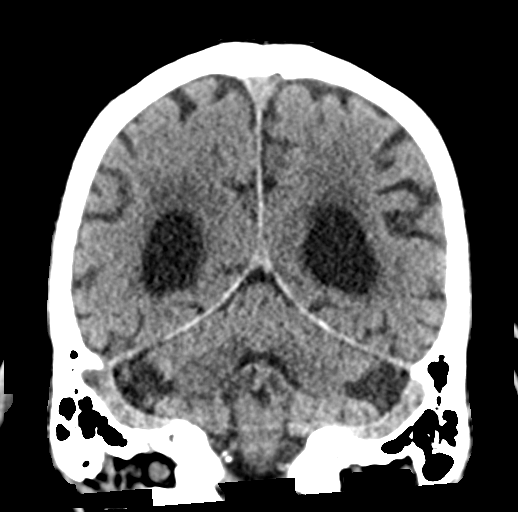
[im 32/71  brain]
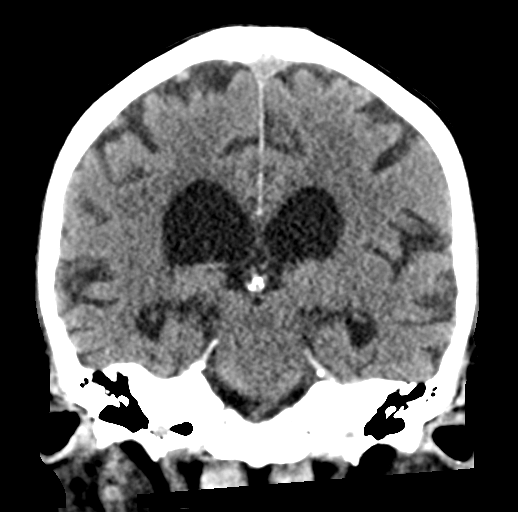
[im 39/71  brain]
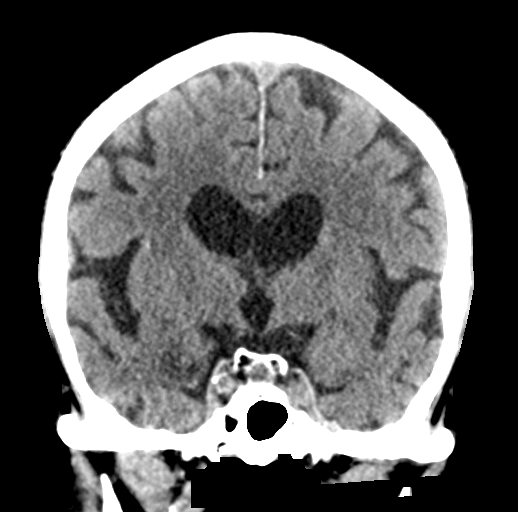

[Series 6: head 3.0 mpr sag · sagittal · 0.32mm/px · 3 of 55 slices shown]
[im 19/55  brain]
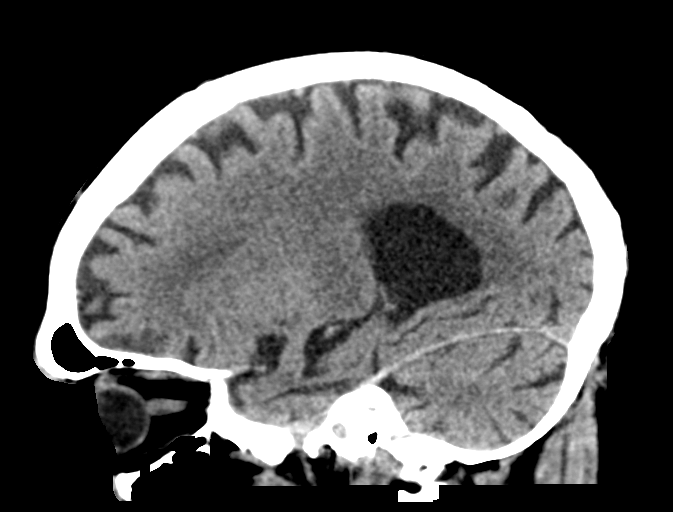
[im 28/55  brain]
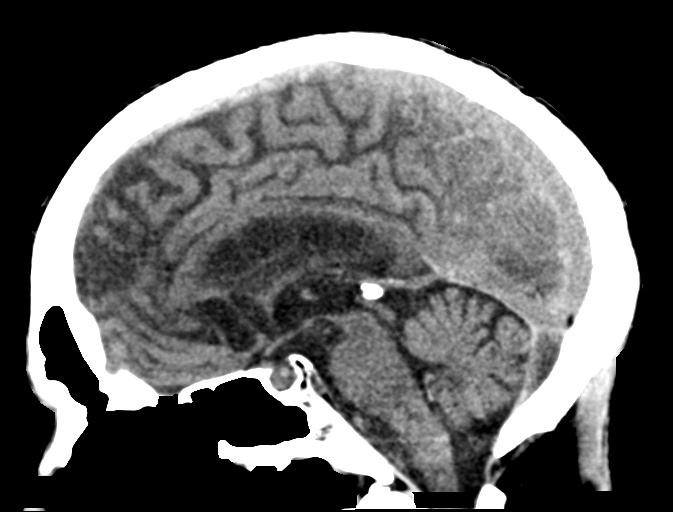
[im 37/55  brain]
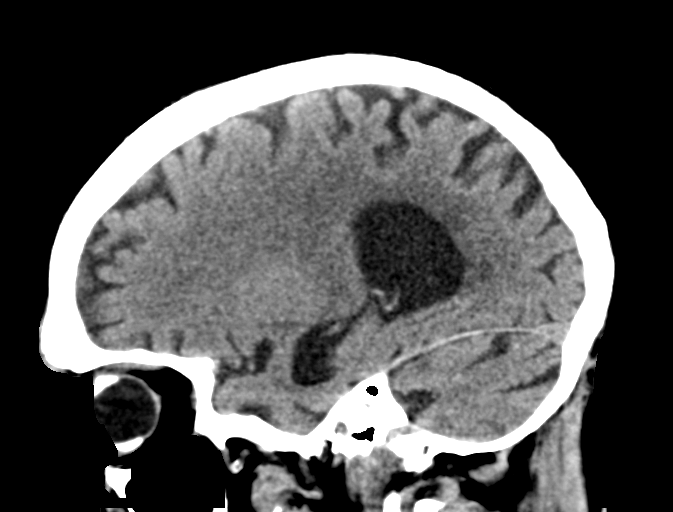

[16 of 47 positions shown; findings below may reference images not displayed]

FINDINGS: Brain: Redemonstration of moderate superficial and central atrophy
with chronic appearing small vessel ischemia. No acute large
vascular territory infarction. Midline fourth ventricle and basal
cisterns. Brainstem and cerebellum are nonacute.

Vascular: Atherosclerosis of the carotid siphons. No hyperdense
vessel sign.

Skull: The visualized skull base, calvarium and extracranial soft
tissues are unremarkable.

Sinuses/Orbits: No acute finding. Bilateral cataract extractions
with right-sided scleral buckle encircling the right globe.

Other: None
IMPRESSION: Chronic stable atrophy and small vessel ischemia without acute
intracranial abnormality.
